# Patient Record
Sex: Male | Born: 1940 | Race: White | Hispanic: No | Marital: Married | Smoking: Former smoker
Health system: Southern US, Community
[De-identification: ages and names within clinical notes are randomized; demographics above are authoritative.]

## PROBLEM LIST (undated history)

## (undated) DIAGNOSIS — M109 Gout, unspecified: Secondary | ICD-10-CM

## (undated) DIAGNOSIS — I499 Cardiac arrhythmia, unspecified: Secondary | ICD-10-CM

## (undated) DIAGNOSIS — I82409 Acute embolism and thrombosis of unspecified deep veins of unspecified lower extremity: Secondary | ICD-10-CM

## (undated) DIAGNOSIS — I10 Essential (primary) hypertension: Secondary | ICD-10-CM

## (undated) DIAGNOSIS — I4891 Unspecified atrial fibrillation: Secondary | ICD-10-CM

## (undated) DIAGNOSIS — N400 Enlarged prostate without lower urinary tract symptoms: Secondary | ICD-10-CM

## (undated) HISTORY — PX: NASAL SINUS SURGERY: SHX719

## (undated) HISTORY — PX: FRACTURE SURGERY: SHX138

## (undated) HISTORY — PX: SHOULDER ARTHROSCOPY: SHX128

## (undated) HISTORY — PX: DUPUYTREN CONTRACTURE RELEASE: SHX1478

## (undated) HISTORY — DX: Acute embolism and thrombosis of unspecified deep veins of unspecified lower extremity: I82.409

---

## 2004-01-28 ENCOUNTER — Other Ambulatory Visit: Payer: Self-pay

## 2006-04-18 ENCOUNTER — Ambulatory Visit: Payer: Self-pay | Admitting: Nurse Practitioner

## 2008-03-24 ENCOUNTER — Emergency Department: Payer: Self-pay | Admitting: Emergency Medicine

## 2008-03-24 ENCOUNTER — Ambulatory Visit: Payer: Self-pay | Admitting: Internal Medicine

## 2009-04-07 ENCOUNTER — Ambulatory Visit: Payer: Self-pay | Admitting: Internal Medicine

## 2013-11-26 ENCOUNTER — Ambulatory Visit: Payer: Self-pay | Admitting: Unknown Physician Specialty

## 2014-04-05 ENCOUNTER — Ambulatory Visit: Payer: Self-pay | Admitting: Surgery

## 2015-04-21 ENCOUNTER — Other Ambulatory Visit: Payer: Self-pay | Admitting: Orthopedic Surgery

## 2015-04-21 DIAGNOSIS — M25511 Pain in right shoulder: Secondary | ICD-10-CM

## 2015-04-21 DIAGNOSIS — S0540XA Penetrating wound of orbit with or without foreign body, unspecified eye, initial encounter: Secondary | ICD-10-CM

## 2015-04-26 ENCOUNTER — Ambulatory Visit
Admission: RE | Admit: 2015-04-26 | Discharge: 2015-04-26 | Disposition: A | Discharge status: Home / Self Care | Payer: Medicare Other | Source: Ambulatory Visit | Attending: Orthopedic Surgery | Admitting: Orthopedic Surgery

## 2015-04-26 DIAGNOSIS — X58XXXA Exposure to other specified factors, initial encounter: Secondary | ICD-10-CM | POA: Insufficient documentation

## 2015-04-26 DIAGNOSIS — S46111A Strain of muscle, fascia and tendon of long head of biceps, right arm, initial encounter: Secondary | ICD-10-CM | POA: Insufficient documentation

## 2015-04-26 DIAGNOSIS — M25511 Pain in right shoulder: Secondary | ICD-10-CM

## 2015-04-26 DIAGNOSIS — S0540XA Penetrating wound of orbit with or without foreign body, unspecified eye, initial encounter: Secondary | ICD-10-CM

## 2015-05-25 ENCOUNTER — Encounter
Admission: RE | Admit: 2015-05-25 | Discharge: 2015-05-25 | Disposition: A | Discharge status: Home / Self Care | Payer: Medicare Other | Source: Ambulatory Visit | Attending: Orthopedic Surgery | Admitting: Orthopedic Surgery

## 2015-05-25 ENCOUNTER — Ambulatory Visit
Admission: RE | Admit: 2015-05-25 | Discharge: 2015-05-25 | Disposition: A | Discharge status: Home / Self Care | Payer: Medicare Other | Source: Ambulatory Visit | Attending: Orthopedic Surgery | Admitting: Orthopedic Surgery

## 2015-05-25 DIAGNOSIS — Z01818 Encounter for other preprocedural examination: Secondary | ICD-10-CM | POA: Insufficient documentation

## 2015-05-25 DIAGNOSIS — I10 Essential (primary) hypertension: Secondary | ICD-10-CM

## 2015-05-25 DIAGNOSIS — Z01810 Encounter for preprocedural cardiovascular examination: Secondary | ICD-10-CM | POA: Insufficient documentation

## 2015-05-25 HISTORY — DX: Essential (primary) hypertension: I10

## 2015-05-25 HISTORY — DX: Gout, unspecified: M10.9

## 2015-05-25 LAB — URINALYSIS COMPLETE WITH MICROSCOPIC (ARMC ONLY)
Bacteria, UA: NONE SEEN
Bilirubin Urine: NEGATIVE
GLUCOSE, UA: NEGATIVE mg/dL
Hgb urine dipstick: NEGATIVE
Ketones, ur: NEGATIVE mg/dL
Leukocytes, UA: NEGATIVE
Nitrite: NEGATIVE
PH: 6 (ref 5.0–8.0)
Protein, ur: NEGATIVE mg/dL
Specific Gravity, Urine: 1.014 (ref 1.005–1.030)

## 2015-05-25 LAB — BASIC METABOLIC PANEL
Anion gap: 10 (ref 5–15)
BUN: 29 mg/dL — ABNORMAL HIGH (ref 6–20)
CO2: 28 mmol/L (ref 22–32)
CREATININE: 1.12 mg/dL (ref 0.61–1.24)
Calcium: 9.5 mg/dL (ref 8.9–10.3)
Chloride: 100 mmol/L — ABNORMAL LOW (ref 101–111)
GLUCOSE: 94 mg/dL (ref 65–99)
Potassium: 3.8 mmol/L (ref 3.5–5.1)
Sodium: 138 mmol/L (ref 135–145)

## 2015-05-25 LAB — CBC
HCT: 40.3 % (ref 40.0–52.0)
Hemoglobin: 13.2 g/dL (ref 13.0–18.0)
MCH: 30.9 pg (ref 26.0–34.0)
MCHC: 32.7 g/dL (ref 32.0–36.0)
MCV: 94.3 fL (ref 80.0–100.0)
Platelets: 192 10*3/uL (ref 150–440)
RBC: 4.28 MIL/uL — AB (ref 4.40–5.90)
RDW: 14.9 % — AB (ref 11.5–14.5)
WBC: 6.9 10*3/uL (ref 3.8–10.6)

## 2015-05-25 LAB — APTT: APTT: 27 s (ref 24–36)

## 2015-05-25 LAB — PROTIME-INR
INR: 0.91
PROTHROMBIN TIME: 12.5 s (ref 11.4–15.0)

## 2015-05-25 NOTE — Patient Instructions (Signed)
  Your procedure is scheduled on:June 08, 2015 Report to Day Surgery. To find out your arrival time please call [phone] between 1PM - 3PM on August 17,2016.  Remember: Instructions that are not followed completely may result in serious medical risk, up to and including death, or upon the discretion of your surgeon and anesthesiologist your surgery may need to be rescheduled.    __x__ 1. Do not eat food or drink liquids after midnight. No gum chewing or hard candies.     __x__ 2. No Alcohol for 24 hours before or after surgery.   ____ 3. Bring all medications with you on the day of surgery if instructed.    __x__ 4. Notify your doctor if there is any change in your medical condition     (cold, fever, infections).     Do not wear jewelry, make-up, hairpins, clips or nail polish.  Do not wear lotions, powders, or perfumes. You may wear deodorant.  Do not shave 48 hours prior to surgery. Men may shave face and neck.  Do not bring valuables to the hospital.    [facility] is not responsible for any belongings or valuables.               Contacts, dentures or bridgework may not be worn into surgery.  Leave your suitcase in the car. After surgery it may be brought to your room.  For patients admitted to the hospital, discharge time is determined by your                treatment team.   Patients discharged the day of surgery will not be allowed to drive home.   Please read over the following fact sheets that you were given:   Surgical Site Infection Prevention   ____ Take these medicines the morning of surgery with A SIP OF WATER:    1. Metoprolol  2.   3.   4.  5.  6.  ____ Fleet Enema (as directed)   __x__ Use CHG Soap as directed  ____ Use inhalers on the day of surgery  ____ Stop metformin 2 days prior to surgery    ____ Take 1/2 of usual insulin dose the night before surgery and none on the morning of surgery.   __x__ Stop Coumadin/Plavix/aspirin on (Stop  aspirin one week prior to surgery)  ____ Stop Anti-inflammatories on    ____ Stop supplements until after surgery.    ____ Bring C-Pap to the hospital.

## 2015-06-08 ENCOUNTER — Ambulatory Visit: Payer: Medicare Other | Admitting: Anesthesiology

## 2015-06-08 ENCOUNTER — Encounter
Admission: RE | Disposition: A | Discharge status: Home / Self Care | Payer: Self-pay | Source: Ambulatory Visit | Attending: Orthopedic Surgery

## 2015-06-08 ENCOUNTER — Encounter: Payer: Self-pay | Admitting: *Deleted

## 2015-06-08 ENCOUNTER — Ambulatory Visit
Admission: RE | Admit: 2015-06-08 | Discharge: 2015-06-08 | Disposition: A | Discharge status: Home / Self Care | Payer: Medicare Other | Source: Ambulatory Visit | Attending: Orthopedic Surgery | Admitting: Orthopedic Surgery

## 2015-06-08 DIAGNOSIS — M19011 Primary osteoarthritis, right shoulder: Secondary | ICD-10-CM | POA: Diagnosis not present

## 2015-06-08 DIAGNOSIS — I10 Essential (primary) hypertension: Secondary | ICD-10-CM | POA: Insufficient documentation

## 2015-06-08 DIAGNOSIS — M7541 Impingement syndrome of right shoulder: Secondary | ICD-10-CM | POA: Diagnosis not present

## 2015-06-08 DIAGNOSIS — Z87891 Personal history of nicotine dependence: Secondary | ICD-10-CM | POA: Diagnosis not present

## 2015-06-08 DIAGNOSIS — M75101 Unspecified rotator cuff tear or rupture of right shoulder, not specified as traumatic: Secondary | ICD-10-CM | POA: Diagnosis present

## 2015-06-08 DIAGNOSIS — S46111A Strain of muscle, fascia and tendon of long head of biceps, right arm, initial encounter: Secondary | ICD-10-CM | POA: Insufficient documentation

## 2015-06-08 DIAGNOSIS — M24011 Loose body in right shoulder: Secondary | ICD-10-CM | POA: Insufficient documentation

## 2015-06-08 DIAGNOSIS — M109 Gout, unspecified: Secondary | ICD-10-CM | POA: Diagnosis not present

## 2015-06-08 DIAGNOSIS — Z79899 Other long term (current) drug therapy: Secondary | ICD-10-CM | POA: Insufficient documentation

## 2015-06-08 DIAGNOSIS — X58XXXA Exposure to other specified factors, initial encounter: Secondary | ICD-10-CM | POA: Insufficient documentation

## 2015-06-08 DIAGNOSIS — Z7982 Long term (current) use of aspirin: Secondary | ICD-10-CM | POA: Diagnosis not present

## 2015-06-08 HISTORY — PX: SHOULDER ARTHROSCOPY WITH OPEN ROTATOR CUFF REPAIR: SHX6092

## 2015-06-08 SURGERY — ARTHROSCOPY, SHOULDER WITH REPAIR, ROTATOR CUFF, OPEN
Anesthesia: General | Laterality: Right | Wound class: Clean

## 2015-06-08 MED ORDER — HYDROMORPHONE HCL 1 MG/ML IJ SOLN: 0.2500 mg | INTRAMUSCULAR | Status: DC | PRN

## 2015-06-08 MED ORDER — SUCCINYLCHOLINE CHLORIDE 20 MG/ML IJ SOLN
INTRAMUSCULAR | Status: DC | PRN
  Administered 2015-06-08: 80 mg via INTRAVENOUS

## 2015-06-08 MED ORDER — CEFAZOLIN SODIUM-DEXTROSE 2-3 GM-% IV SOLR
2.0000 g | Freq: Once | INTRAVENOUS | Status: AC
  Administered 2015-06-08: 2 g via INTRAVENOUS

## 2015-06-08 MED ORDER — ACETAMINOPHEN 10 MG/ML IV SOLN
INTRAVENOUS | Status: AC
  Filled 2015-06-08: qty 100

## 2015-06-08 MED ORDER — FAMOTIDINE 20 MG PO TABS
ORAL_TABLET | ORAL | Status: AC
  Administered 2015-06-08: 20 mg via ORAL
  Filled 2015-06-08: qty 1

## 2015-06-08 MED ORDER — ROCURONIUM BROMIDE 100 MG/10ML IV SOLN
INTRAVENOUS | Status: DC | PRN
  Administered 2015-06-08: 20 mg via INTRAVENOUS
  Administered 2015-06-08: 10 mg via INTRAVENOUS

## 2015-06-08 MED ORDER — FAMOTIDINE 20 MG PO TABS
20.0000 mg | ORAL_TABLET | Freq: Once | ORAL | Status: AC
  Administered 2015-06-08: 20 mg via ORAL

## 2015-06-08 MED ORDER — OXYCODONE HCL 5 MG PO TABS: 5.0000 mg | ORAL_TABLET | ORAL | Status: DC | PRN

## 2015-06-08 MED ORDER — ONDANSETRON HCL 4 MG/2ML IJ SOLN: 4.0000 mg | Freq: Once | INTRAMUSCULAR | Status: DC | PRN

## 2015-06-08 MED ORDER — MIDAZOLAM HCL 2 MG/2ML IJ SOLN
INTRAMUSCULAR | Status: DC | PRN
  Administered 2015-06-08: 2 mg via INTRAVENOUS

## 2015-06-08 MED ORDER — FENTANYL CITRATE (PF) 100 MCG/2ML IJ SOLN: 25.0000 ug | INTRAMUSCULAR | Status: DC | PRN

## 2015-06-08 MED ORDER — PROPOFOL 10 MG/ML IV BOLUS
INTRAVENOUS | Status: DC | PRN
  Administered 2015-06-08: 150 mg via INTRAVENOUS

## 2015-06-08 MED ORDER — EPINEPHRINE HCL 1 MG/ML IJ SOLN
INTRAMUSCULAR | Status: DC | PRN
  Administered 2015-06-08: 7 mL

## 2015-06-08 MED ORDER — ACETAMINOPHEN 10 MG/ML IV SOLN
INTRAVENOUS | Status: DC | PRN
  Administered 2015-06-08: 1000 mg via INTRAVENOUS

## 2015-06-08 MED ORDER — LACTATED RINGERS IV SOLN
INTRAVENOUS | Status: DC
  Administered 2015-06-08: 07:00:00 via INTRAVENOUS

## 2015-06-08 MED ORDER — FENTANYL CITRATE (PF) 100 MCG/2ML IJ SOLN
INTRAMUSCULAR | Status: DC | PRN
  Administered 2015-06-08: 50 ug via INTRAVENOUS
  Administered 2015-06-08: 100 ug via INTRAVENOUS
  Administered 2015-06-08: 50 ug via INTRAVENOUS

## 2015-06-08 MED ORDER — NEOMYCIN-POLYMYXIN B GU 40-200000 IR SOLN
Status: DC | PRN
  Administered 2015-06-08: 2 mL

## 2015-06-08 MED ORDER — ONDANSETRON HCL 4 MG PO TABS: 4.0000 mg | ORAL_TABLET | Freq: Four times a day (QID) | ORAL | Status: DC | PRN

## 2015-06-08 MED ORDER — LIDOCAINE HCL 1 % IJ SOLN
INTRAMUSCULAR | Status: DC | PRN
Start: 2015-06-08 — End: 2015-06-08
  Administered 2015-06-08: 15 mL

## 2015-06-08 MED ORDER — CEFAZOLIN SODIUM-DEXTROSE 2-3 GM-% IV SOLR
INTRAVENOUS | Status: AC
  Filled 2015-06-08: qty 50

## 2015-06-08 MED ORDER — EPHEDRINE SULFATE 50 MG/ML IJ SOLN
INTRAMUSCULAR | Status: DC | PRN
  Administered 2015-06-08: 5 mg via INTRAVENOUS
  Administered 2015-06-08: 10 mg via INTRAVENOUS
  Administered 2015-06-08: 5 mg via INTRAVENOUS

## 2015-06-08 MED ORDER — BUPIVACAINE HCL 0.25 % IJ SOLN
INTRAMUSCULAR | Status: DC | PRN
  Administered 2015-06-08: 30 mL

## 2015-06-08 SURGICAL SUPPLY — 65 items
ADAPTER IRRIG TUBE 2 SPIKE SOL (ADAPTER) ×6 IMPLANT
ANCHOR DBLROW ROT CUFF 2.8 MAG (Anchor) ×6 IMPLANT
ARTHROWAND PARAGON T2 (SURGICAL WAND) ×3
BUR RADIUS 4.0X18.5 (BURR) ×3 IMPLANT
BUR RADIUS 5.5 (BURR) ×3 IMPLANT
CANNULA 5.75X7 CRYSTAL CLEAR (CANNULA) ×6 IMPLANT
CANNULA PARTIAL THREAD 2X7 (CANNULA) ×3 IMPLANT
CANNULA TWIST IN 8.25X9CM (CANNULA) ×6 IMPLANT
CLOSURE WOUND 1/2 X4 (GAUZE/BANDAGES/DRESSINGS) ×1
CONNECTOR M SMARTSTITCH (Connector) ×3 IMPLANT
COOLER POLAR GLACIER W/PUMP (MISCELLANEOUS) ×3 IMPLANT
DRAPE IMP U-DRAPE 54X76 (DRAPES) ×6 IMPLANT
DRAPE INCISE IOBAN 66X45 STRL (DRAPES) ×3 IMPLANT
DRAPE U-SHAPE 47X51 STRL (DRAPES) ×3 IMPLANT
DURAPREP 26ML APPLICATOR (WOUND CARE) ×9 IMPLANT
GAUZE PETRO XEROFOAM 1X8 (MISCELLANEOUS) ×3 IMPLANT
GAUZE SPONGE 4X4 12PLY STRL (GAUZE/BANDAGES/DRESSINGS) ×6 IMPLANT
GLOVE BIOGEL PI IND STRL 9 (GLOVE) ×1 IMPLANT
GLOVE BIOGEL PI INDICATOR 9 (GLOVE) ×2
GLOVE SURG 9.0 ORTHO LTXF (GLOVE) ×6 IMPLANT
GOWN STRL REUS TWL 2XL XL LVL4 (GOWN DISPOSABLE) ×3 IMPLANT
GOWN STRL REUS W/ TWL LRG LVL3 (GOWN DISPOSABLE) ×1 IMPLANT
GOWN STRL REUS W/ TWL LRG LVL4 (GOWN DISPOSABLE) ×1 IMPLANT
GOWN STRL REUS W/TWL LRG LVL3 (GOWN DISPOSABLE) ×2
GOWN STRL REUS W/TWL LRG LVL4 (GOWN DISPOSABLE) ×2
IV LACTATED RINGER IRRG 3000ML (IV SOLUTION) ×14
IV LR IRRIG 3000ML ARTHROMATIC (IV SOLUTION) ×7 IMPLANT
KIT RM TURNOVER STRD PROC AR (KITS) ×3 IMPLANT
KIT STABILIZATION SHOULDER (MISCELLANEOUS) ×3 IMPLANT
MANIFOLD NEPTUNE II (INSTRUMENTS) ×3 IMPLANT
MASK FACE SPIDER DISP (MASK) ×3 IMPLANT
MAT BLUE FLOOR 46X72 FLO (MISCELLANEOUS) ×6 IMPLANT
NDL SAFETY 18GX1.5 (NEEDLE) ×3 IMPLANT
NDL SAFETY 22GX1.5 (NEEDLE) ×3 IMPLANT
NS IRRIG 500ML POUR BTL (IV SOLUTION) ×3 IMPLANT
PACK ARTHROSCOPY SHOULDER (MISCELLANEOUS) ×3 IMPLANT
PAD GROUND ADULT SPLIT (MISCELLANEOUS) ×3 IMPLANT
PAD WRAPON POLAR SHDR UNIV (MISCELLANEOUS) ×1 IMPLANT
PASSER SUT CAPTURE FIRST (SUTURE) ×3 IMPLANT
SET TUBE SUCT SHAVER OUTFL 24K (TUBING) ×3 IMPLANT
SET TUBE TIP INTRA-ARTICULAR (MISCELLANEOUS) ×3 IMPLANT
SLING ULTRA II LG (MISCELLANEOUS) IMPLANT
SLING ULTRA II M (MISCELLANEOUS) ×3 IMPLANT
STRIP CLOSURE SKIN 1/2X4 (GAUZE/BANDAGES/DRESSINGS) ×2 IMPLANT
SUT CO BRAID (SUTURE) ×9 IMPLANT
SUT ETHILON 4-0 (SUTURE)
SUT ETHILON 4-0 FS2 18XMFL BLK (SUTURE)
SUT KNTLS 2.8 MAGNUM (Anchor) ×9 IMPLANT
SUT MNCRL 4-0 (SUTURE)
SUT MNCRL 4-0 27XMFL (SUTURE)
SUT PDS AB 0 CT1 27 (SUTURE) IMPLANT
SUT VIC AB 0 CT1 36 (SUTURE) IMPLANT
SUT VIC AB 2-0 CT2 27 (SUTURE) ×3 IMPLANT
SUTURE ETHLN 4-0 FS2 18XMF BLK (SUTURE) IMPLANT
SUTURE MAGNUM WIRE 2X48 BLK (SUTURE) ×6 IMPLANT
SUTURE MNCRL 4-0 27XMF (SUTURE) IMPLANT
SUTURE OPUS MAGNUM SZ 2 WHT (SUTURE) ×9 IMPLANT
SYRINGE 10CC LL (SYRINGE) ×3 IMPLANT
TAPE MICROFOAM 4IN (TAPE) ×3 IMPLANT
TUBING ARTHRO INFLOW-ONLY STRL (TUBING) ×3 IMPLANT
TUBING CONNECTING 10 (TUBING) ×2 IMPLANT
TUBING CONNECTING 10' (TUBING) ×1
WAND ARTHRO PARAGON T2 (SURGICAL WAND) ×1 IMPLANT
WAND HAND CNTRL MULTIVAC 90 (MISCELLANEOUS) ×3 IMPLANT
WRAPON POLAR PAD SHDR UNIV (MISCELLANEOUS) ×3

## 2015-06-08 NOTE — Transfer of Care (Signed)
Immediate Anesthesia Transfer of Care Note  Patient: Nathan Haynes  Procedure(s) Performed: Procedure(s): SHOULDER ARTHROSCOPY WITH OPEN ROTATOR CUFF REPAIR (Right)  Patient Location: PACU  Anesthesia Type:General  Level of Consciousness: awake, alert  and oriented  Airway & Oxygen Therapy: Patient Spontanous Breathing and Patient connected to face mask oxygen  Post-op Assessment: Report given to RN and Post -op Vital signs reviewed and stable  Post vital signs: Reviewed and stable  Last Vitals:  Filed Vitals:   06/08/15 1032  BP: 112/63  Pulse: 66  Temp: 37.1 C  Resp: 16    Complications: No apparent anesthesia complications

## 2015-06-08 NOTE — Anesthesia Procedure Notes (Signed)
Procedure Name: Intubation Date/Time: 06/08/2015 7:43 AM Performed by: Omer Jack Pre-anesthesia Checklist: Patient identified, Patient being monitored, Timeout performed, Emergency Drugs available and Suction available Patient Re-evaluated:Patient Re-evaluated prior to inductionOxygen Delivery Method: Circle system utilized Preoxygenation: Pre-oxygenation with 100% oxygen Intubation Type: IV induction Ventilation: Mask ventilation without difficulty Laryngoscope Size: 3 and Glidescope Grade View: Grade II Tube type: Oral Tube size: 7.5 mm Number of attempts: 1 Airway Equipment and Method: Stylet Placement Confirmation: ETT inserted through vocal cords under direct vision,  positive ETCO2 and breath sounds checked- equal and bilateral Secured at: 21 cm Tube secured with: Tape Dental Injury: Teeth and Oropharynx as per pre-operative assessment

## 2015-06-08 NOTE — Anesthesia Preprocedure Evaluation (Signed)
Anesthesia Evaluation  Patient identified by MRN, date of birth, ID band Patient awake    Reviewed: Allergy & Precautions, H&P , NPO status , Patient's Chart, lab work & pertinent test results, reviewed documented beta blocker date and time   Airway Mallampati: II  TM Distance: >3 FB Neck ROM: full    Dental  (+) Teeth Intact, Poor Dentition, Chipped   Pulmonary neg pulmonary ROS, former smoker,    Pulmonary exam normal       Cardiovascular Exercise Tolerance: Good hypertension, On Medications negative cardio ROS Normal cardiovascular examRhythm:regular Rate:Normal     Neuro/Psych    GI/Hepatic negative GI ROS, Neg liver ROS,   Endo/Other  negative endocrine ROS  Renal/GU negative Renal ROS     Musculoskeletal   Abdominal   Peds  Hematology negative hematology ROS (+)   Anesthesia Other Findings   Reproductive/Obstetrics negative OB ROS                             Anesthesia Physical Anesthesia Plan  ASA: III  Anesthesia Plan: General ETT   Post-op Pain Management:    Induction:   Airway Management Planned:   Additional Equipment:   Intra-op Plan:   Post-operative Plan:   Informed Consent: I have reviewed the patients History and Physical, chart, labs and discussed the procedure including the risks, benefits and alternatives for the proposed anesthesia with the patient or authorized representative who has indicated his/her understanding and acceptance.     Plan Discussed with: CRNA  Anesthesia Plan Comments:         Anesthesia Quick Evaluation

## 2015-06-08 NOTE — Op Note (Signed)
06/08/2015  11:14 AM  PATIENT:  Nathan Haynes  74 y.o. 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  SURGEON:  Surgeon(s) and Role:    * Thornton Park, MD - Primary  ANESTHESIA:   general and local with 1% lidocaine plain for injection of the arthroscopy incisions and 0.25% Marcaine plain into the subacromial space postoperatively   PREOPERATIVE INDICATIONS:  Nathan Haynes is a  74 y.o. male with a diagnosis of right shoulder FULL THICKNESS rotator cuff tear who failed conservative management including NSAIDs, exercise and subacromial injections and elected for surgical management.  An MRI of the right shoulder has revealed a full-thickness and retracted rotator cuff tear involving the supra and infraspinatus  The risks benefits and alternatives were discussed with the patient preoperatively including but not limited to the risks of infection, bleeding, nerve injury, persistent pain or weakness, failure of the hardware, re-tear of the rotator cuff and the need for further surgery. Medical risks include DVT and pulmonary embolism, myocardial infarction, stroke, pneumonia, respiratory failure and death. Patient understood these risks and wished to proceed.  OPERATIVE IMPLANTS: ArthroCare Magnum M anchors 2 and Magnum 2 anchors 3  OPERATIVE FINDINGS: Full-thickness rotator cuff tear involving the supra and infraspinatus with retraction to the humeral head,  spontaneous rupture of the biceps tendon, mild glenohumeral arthrosis with intra-articular loose body  OPERATIVE PROCEDURE: The patient was met in the preoperative area. The operative shoulder was signed with the word yes and my initials according the hospital's correct site of surgery protocol. The patient underwent placement of an interscalene block by the anesthesia service.  Patient was brought to the operating room where they underwent general endotracheal intubation. They were placed in a beachchair position.  A spider arm positioner was used for this case. Examination under anesthesia revealed no limitation of motion or instability with load shift testing. The patient had a negative sulcus sign.  Patient was prepped and draped in a sterile fashion. A timeout was performed to verify the patient's name, date of birth, medical record number, correct site of surgery and correct procedure to be performed there was also used to verify the patient received antibiotics that all appropriate instruments, implants and radiographs studies were available in the room. Once all in attendance were in agreement case began.  Bony landmarks were drawn out with a surgical marker along with proposed arthroscopy incisions. These were pre-injected with 1% lidocaine plain. An 11 blade was used to establish a posterior portal through which the arthroscope was placed in the glenohumeral joint. A full diagnostic examination of the shoulder was performed.    Findings on arthroscopy: As above  The arthroscope was then placed in the subacromial space. Extensive bursitis was encountered and debrided using a 4-0 resector shaver blade and a 90 ArthroCare wand from a lateral portal which was established under direct visualization using an 18-gauge spinal needle. A subacromial decompression was also performed using a 5.5 mm resector shaver blade from the lateral portal. Through the anterior portal, the 5.5 mm resector shaver blade  was inserted and a distal clavicle excision  was performed. The patient had advanced arthritis in the acromioclavicular joint with minimal joint space left. The greater tuberosity footprint was also debrided with a 5.5 mm resector shaver blade from the lateral portal until punctate bleeding was identified which will assist the tendon to bone healing. Three Smart stitches were placed in the lateral border of the rotator cuff tear. All arthroscopic instruments were then removed and the mini-open portion of the case began.   A saber-type incision was made along the lateral border of the acromion. The deltoid muscle was identified and split in line with its fibers which allowed visualization of the rotator cuff. The Smart stitches previously placed in the lateral border of the rotator cuff were brought out through the deltoid split.  A 5.5 mm resector shaver blade was then used to bur the greater tuberosity removing all torn fibers of the rotator cuff.  Two Magnum M anchors were placed at the articular margin of the humeral head with the greater tuberosity. The suture limbs of the Magnum M anchor were passed medially through the rotator cuff using a first pass suture passer.  The Smart stitches were then anchored to the humeral head using Magnum 2 anchors. These anchors were tensioned to allow for anatomic reduction of the rotator cuff to the greater tuberosity footprint. Arthroscopic images of the repair were taken with the arthroscope both externally and from inside the glenohumeral joint.  All incisions were copiously irrigated. The deltoid fascia was repaired using a 0 Vicryl suture.  The subcutaneous tissue of all incisions were closed with a 2-0 Vicryl. Skin closure for the arthroscopic incisions was performed with 4-0 nylon. The skin edges of the saber incision was approximated with a running 4-0 undyed Monocryl.  Steri-Strips were applied over the incisions. 30 cc of 0.25% Marcaine plain was injected in  subacromial space to assist with postop pain control. A dry sterile dressing was applied.  The patient was placed in an abduction sling, with a Polar Caresleeve, a TENS unit.  All sharp and it instrument counts were correct at the conclusion of the case. I was scrubbed and present for the entire case. I spoke with the patient's family postoperatively to let them know the case had gone without complication and the patient was stable in recovery room.    Timoteo Gaul, MD

## 2015-06-08 NOTE — Discharge Instructions (Addendum)
Wear sling at all times, including sleep.  You will need to use the sling for a total of 4 weeks following surgery.  Do not try and lift your arm away from your body for any reason.   Keep the dressing dry.  You may remove bandage in 3 days.  Leave the Steri-Strips (white medical tape) in place.  You may place additional Band-Aids over top of the Steri-Strips if you wish.  May shower once dressing is removed in 3 days.  Remove sling carefully only for showers, leaving arm down by your side while in the shower.  Make sure to take some pain medication this evening before you fall asleep, in preparation for the nerve block wearing off in the middle of the night.  If the the pain medication causes itching, or is too strong, try taking a single tablet at a time, or combining with Benadryl.  You may be most comfortable sleeping in a recliner.  If you do sleep in near bed, placed pillows behind the shoulder that have the operation to support it.  AMBULATORY SURGERY  DISCHARGE INSTRUCTIONS   1) The drugs that you were given will stay in your system until tomorrow so for the next 24 hours you should not:  A) Drive an automobile B) Make any legal decisions C) Drink any alcoholic beverage  2) You may resume regular meals tomorrow.  Today it is better to start with liquids and gradually work up to solid foods.  You may eat anything you prefer, but it is better to start with liquids, then soup and crackers, and gradually work up to solid foods.  3) Please notify your doctor immediately if you have any unusual bleeding, trouble breathing, redness and pain at the surgery site, drainage, fever, or pain not relieved by medication.  4) Additional Instructions:  Please contact your physician with any problems or Same Day Surgery at [phone], Monday through Friday 6 am to 4 pm, or  at [place] Main number at [phone]. 

## 2015-06-08 NOTE — H&P (Addendum)
PREOPERATIVE H&P  Chief Complaint: FULL THICKNESS R.C.R, shoulder  HPI: Nathan Haynes is a 74 y.o. right-hand dominant male who presents for preoperative history and physical with a diagnosis of FULL THICKNESS ROTATOR cuff tear of his right shoulder. MRI shows that a full-thickness tear with retraction. Symptoms are rated as moderate to severe, and have been worsening.  This is significantly impairing activities of daily living.  He has elected for surgical management. He has failed nonoperative management including exercise, and NSAIDS and subacromial cortical steroids injection.    Past Medical History  Diagnosis Date  . Hypertension   . Gout attack    Past Surgical History  Procedure Laterality Date  . Shoulder arthroscopy Left   . Fracture surgery Right     clavicle  . Dupuytren contracture release Bilateral   . Nasal sinus surgery     Social History   Social History  . Marital Status: Married    Spouse Name: N/A  . Number of Children: N/A  . Years of Education: N/A   Social History Main Topics  . Smoking status: Former Smoker -- 1.00 packs/day    Types: Cigarettes  . Smokeless tobacco: Never Used  . Alcohol Use: 1.2 oz/week    2 Glasses of wine per week  . Drug Use: No  . Sexual Activity: Not Asked   Other Topics Concern  . None   Social History Narrative   History reviewed. No pertinent family history. No Known Allergies Prior to Admission medications   Medication Sig Start Date End Date Taking? Authorizing Provider  aspirin EC 81 MG tablet Take 81 mg by mouth daily.   Yes Historical Provider, MD  atorvastatin (LIPITOR) 20 MG tablet Take 20 mg by mouth daily.   Yes Historical Provider, MD  losartan-hydrochlorothiazide (HYZAAR) 100-25 MG per tablet Take 1 tablet by mouth daily.   Yes Historical Provider, MD  metoprolol succinate (TOPROL-XL) 100 MG 24 hr tablet Take 100 mg by mouth daily. Take with or immediately following a meal.   Yes Historical Provider, MD      Positive ROS: All other systems have been reviewed and were otherwise negative with the exception of those mentioned in the HPI and as above.  Physical Exam: General: Alert, no acute distress Cardiovascular: Regular rate and rhythm, no murmurs rubs or gallops.  No pedal edema Respiratory: Clear to auscultation bilaterally, no wheezes rales or rhonchi. No cyanosis, no use of accessory musculature GI: No organomegaly, abdomen is soft and non-tender nondistended with positive bowel sounds. Skin: Skin intact, no lesions within the operative field. Neurologic: Sensation intact distally Psychiatric: Patient is competent for consent with normal mood and affect Lymphatic: No axillary or cervical lymphadenopathy  MUSCULOSKELETAL: Right shoulder: Patient can abduct actively to approximately 60 before pain limits further motion. He can forward elevate to approximately 80-90 with pain. He has significant weakness of his supraspinatus and external rotators. Subscapularis function is 5 out of 5 strength. He has intact sensation light touch in palpable radial pulse. He has before meals joint tenderness but no step-off.     Assessment: FULL THICKNESS R.C.R, Right shoulder  Plan: Plan for Procedure(s): SHOULDER ARTHROSCOPY WITH OPEN ROTATOR CUFF REPAIR  And a long discussion my office prior to the date of surgery with the patient and his wife. I explained in detail the operation and postoperative course including 4 weeks in a sling. Patient wished to proceed with surgery given his failure of conservative management and the profound affect his symptoms are  having on his activities of daily living.  I discussed the risks and benefits of surgery. The risks include but are not limited to infection, bleeding requiring blood transfusion, nerve or blood vessel injury, joint stiffness or loss of motion, persistent pain, weakness or instability, malunion, nonunion and hardware failure and the need for  further surgery. Medical risks include but are not limited to DVT and pulmonary embolism, myocardial infarction, stroke, pneumonia, respiratory failure and death. Patient understood these risks and wished to proceed.   Juanell Fairly, MD   06/08/2015 7:25 AM

## 2015-06-08 NOTE — Anesthesia Postprocedure Evaluation (Signed)
  Anesthesia Post-op Note  Patient: Nathan Haynes  Procedure(s) Performed: Procedure(s): SHOULDER ARTHROSCOPY WITH OPEN ROTATOR CUFF REPAIR (Right)  Anesthesia type:General ETT  Patient location: PACU  Post pain: Pain level controlled  Post assessment: Post-op Vital signs reviewed, Patient's Cardiovascular Status Stable, Respiratory Function Stable, Patent Airway and No signs of Nausea or vomiting  Post vital signs: Reviewed and stable  Last Vitals:  Filed Vitals:   06/08/15 1221  BP: 123/67  Pulse: 54  Temp:   Resp: 16    Level of consciousness: awake, alert  and patient cooperative  Complications: No apparent anesthesia complications

## 2016-08-23 IMAGING — MR MR SHOULDER*R* W/O CM
5 series · 40 of 40 positions shown · non-contrast
Comparison: None.

CLINICAL DATA: Right shoulder pain with decreased range of motion.

EXAM:
MRI OF THE RIGHT SHOULDER WITHOUT CONTRAST
TECHNIQUE: Multiplanar, multisequence MR imaging of the shoulder was performed.
No intravenous contrast was administered.

[Series 5: T2 fat-sat · axial · 4.0mm · 0.47mm/px · z∈[-65,+45]mm · 10 of 26 slices shown (1 of 2)]
[im 1/26]
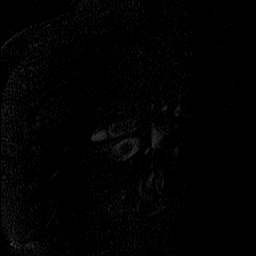
[im 3/26]
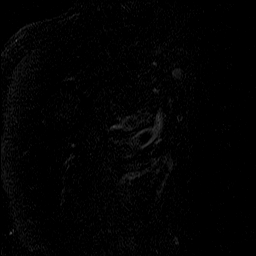
[im 6/26]
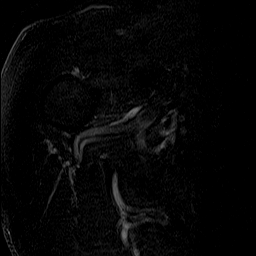
[im 9/26]
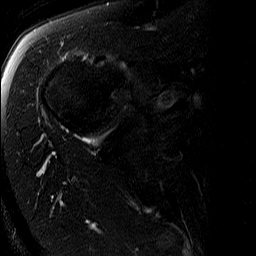
[im 12/26]
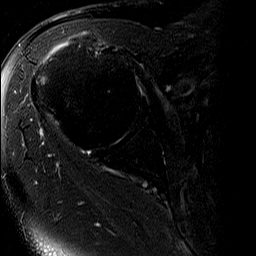
[im 14/26]
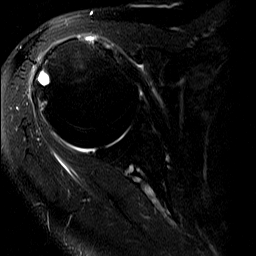
[im 17/26]
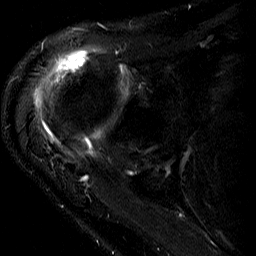
[im 20/26]
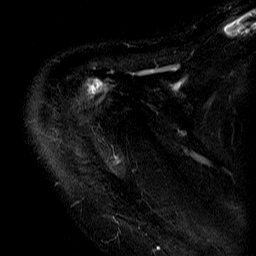
[im 23/26]
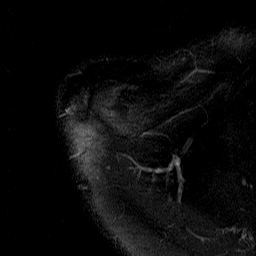
[im 26/26]
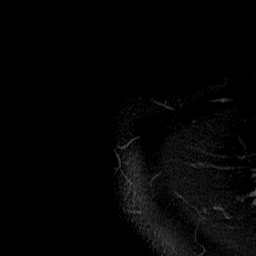

[Series 6: PD · oblique · 4.0mm · 0.62mm/px · 7 of 19 slices shown]
[im 1/19]
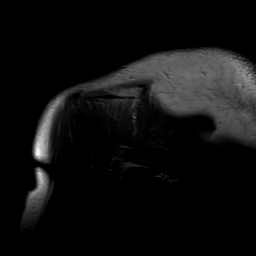
[im 4/19]
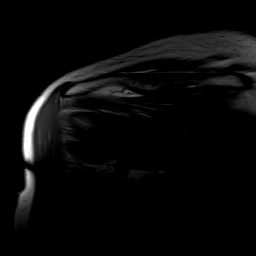
[im 7/19]
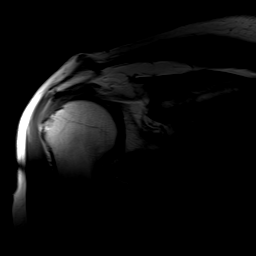
[im 10/19]
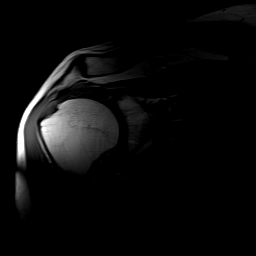
[im 13/19]
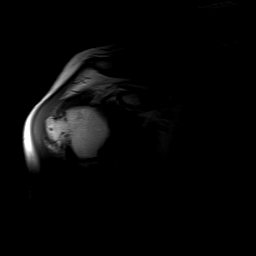
[im 16/19]
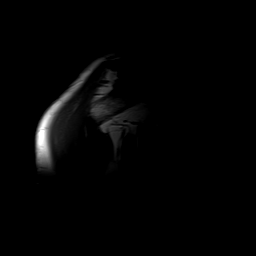
[im 19/19]
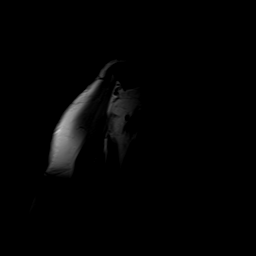

[Series 7: T1 · oblique · 4.0mm · 0.62mm/px · 8 of 23 slices shown]
[im 1/23]
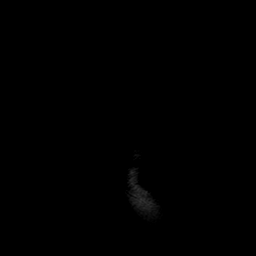
[im 4/23]
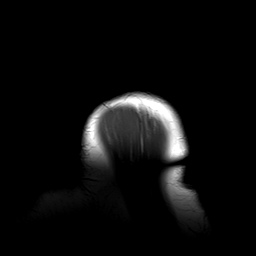
[im 7/23]
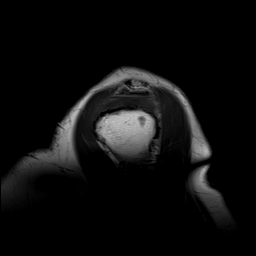
[im 10/23]
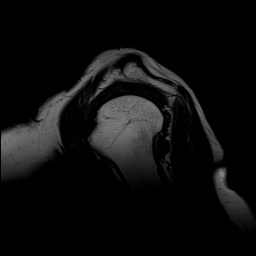
[im 13/23]
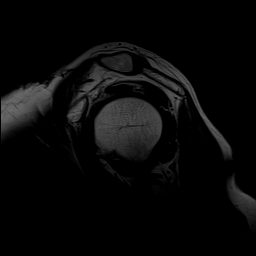
[im 16/23]
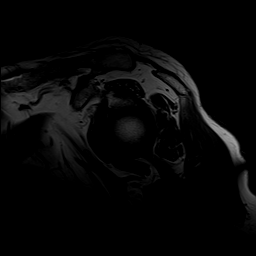
[im 19/23]
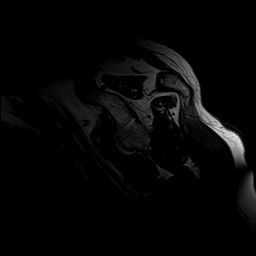
[im 23/23]
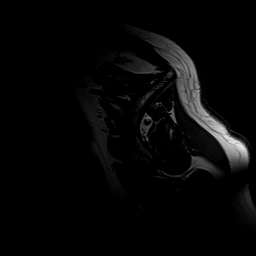

[Series 8: T2 fat-sat · oblique · 4.0mm · 0.62mm/px · 8 of 23 slices shown (2 of 2)]
[im 1/23]
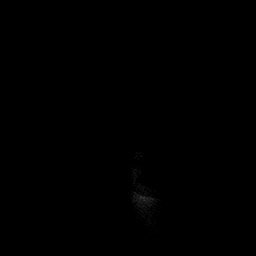
[im 4/23]
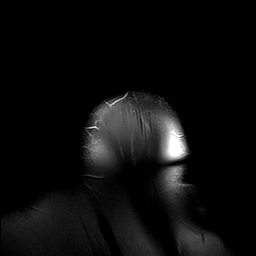
[im 7/23]
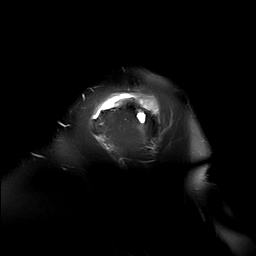
[im 10/23]
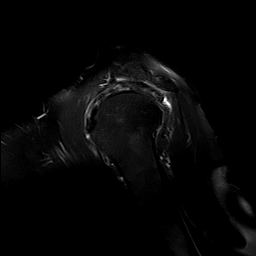
[im 13/23]
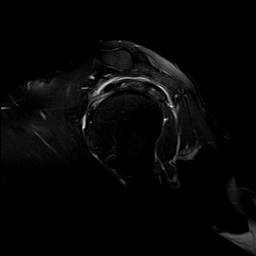
[im 16/23]
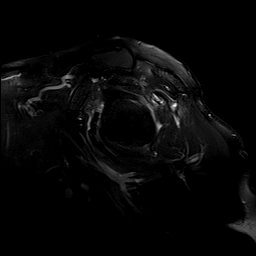
[im 19/23]
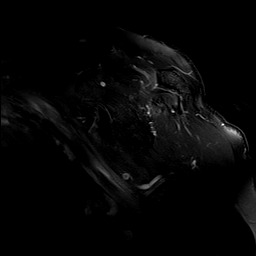
[im 23/23]
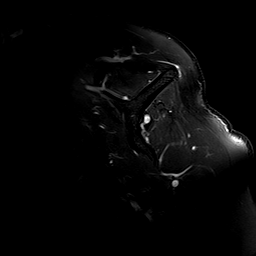

[Series 101: T2 · oblique · 4.0mm · 0.62mm/px · 7 of 19 slices shown]
[im 1/19]
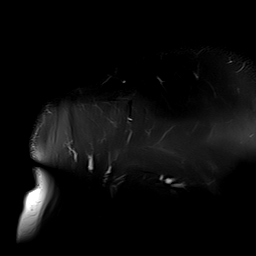
[im 4/19]
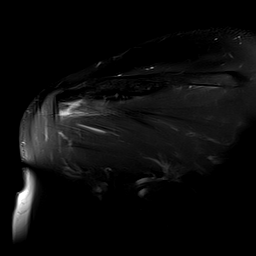
[im 7/19]
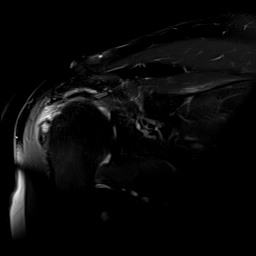
[im 10/19]
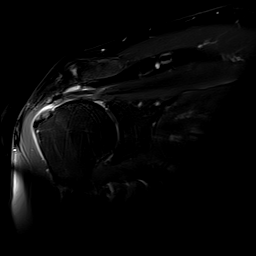
[im 13/19]
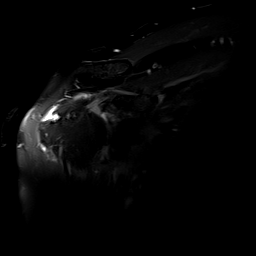
[im 16/19]
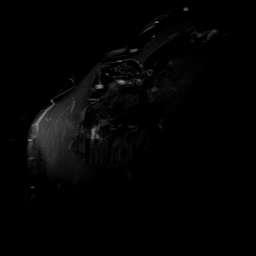
[im 19/19]
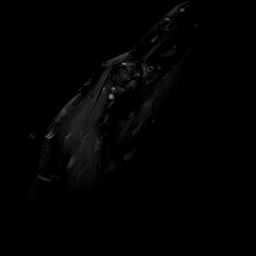

[40 of 40 positions shown; findings below may reference images not displayed]

FINDINGS: Rotator cuff: 27 x 15 mm full-thickness slightly retracted tear of
the supraspinatus tendon. Severe degenerative tendinopathy of the
distal infraspinatus without a definitive tear distally. There is a
small intrasubstance tear at the musculotendinous junction. Teres
minor and subscapularis tendons are normal.

Muscles:  Slight atrophy of all of the muscles of the rotator cuff.

Biceps long head: Properly located and intact. Degeneration of the
intra-articular portion of the tendon.

Acromioclavicular Joint:  Normal.  Type 1 acromion.

Glenohumeral Joint: Normal.

Labrum:  Normal.

Bones: Focal 8 mm degenerative cyst in the greater tuberosity.
Otherwise normal.
IMPRESSION: Large full-thickness retracted tear of the supraspinatus tendon.
Severe degeneration of the distal infraspinatus tendon.

## 2017-07-16 ENCOUNTER — Other Ambulatory Visit: Payer: Self-pay | Admitting: Family Medicine

## 2017-07-16 DIAGNOSIS — Z136 Encounter for screening for cardiovascular disorders: Secondary | ICD-10-CM

## 2017-07-21 ENCOUNTER — Ambulatory Visit: Payer: Medicare HMO

## 2017-07-25 ENCOUNTER — Ambulatory Visit (INDEPENDENT_AMBULATORY_CARE_PROVIDER_SITE_OTHER): Payer: Medicare HMO | Admitting: Vascular Surgery

## 2017-07-25 ENCOUNTER — Encounter (INDEPENDENT_AMBULATORY_CARE_PROVIDER_SITE_OTHER): Payer: Self-pay | Admitting: Vascular Surgery

## 2017-07-25 VITALS — BP 126/75 | HR 61 | Resp 16 | Ht 69.0 in | Wt 167.0 lb

## 2017-07-25 DIAGNOSIS — M7989 Other specified soft tissue disorders: Secondary | ICD-10-CM | POA: Diagnosis not present

## 2017-07-25 DIAGNOSIS — M109 Gout, unspecified: Secondary | ICD-10-CM

## 2017-07-25 DIAGNOSIS — I10 Essential (primary) hypertension: Secondary | ICD-10-CM

## 2017-07-25 NOTE — Assessment & Plan Note (Signed)
blood pressure control important in reducing the progression of atherosclerotic disease. On appropriate oral medications.  

## 2017-07-25 NOTE — Assessment & Plan Note (Signed)

## 2017-07-25 NOTE — Progress Notes (Signed)
Patient ID: Nathan Haynes, male   DOB: [birth year], 75 y.o.   MRN: [id number]  Chief Complaint  Patient presents with  . New Patient (Initial Visit)    VV and Cellulitis    HPI Nathan Haynes is a 76 y.o. male.  I am asked to see the patient by K. Rosalie Gums, NP  for evaluation of left leg swelling. The patient has had a handful of gout attacks over the past few years affecting the left foot and ankle. These are exquisitely painful and associated with swelling. More recently, over the past couple of months he has had left lower extremity swelling down around the foot and ankle. On this instance, it is not exquisitely painful. It has been coming more of a nuisance to him. He denies any fever or chills. He has had cellulitis and gout previously in that left foot and ankle. He has not had ulceration or infection. He denies fever or chills. He has no right lower extremity symptoms. No recent causative event or inciting factor that started the swelling. Elevation and when he first wakes up in the morning resolves the swelling. 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 The patient has no previous history of deep venous thrombosis or superficial thrombophlebitis to their knowledge.     Past Medical History:  Diagnosis Date  . Gout attack   . Hypertension     Past Surgical History:  Procedure Laterality Date  . DUPUYTREN CONTRACTURE RELEASE Bilateral   . FRACTURE SURGERY Right    clavicle  . NASAL SINUS SURGERY    . SHOULDER ARTHROSCOPY Left   . SHOULDER ARTHROSCOPY WITH OPEN ROTATOR CUFF REPAIR Right 06/08/2015   Procedure: SHOULDER ARTHROSCOPY WITH OPEN ROTATOR CUFF REPAIR;  Surgeon: Juanell Fairly, MD;  Location: [facility] ORS;  Service: Orthopedics;  Laterality: Right;    Family history No bleeding or clotting disorders  Social History Social History  Substance Use Topics  . Smoking status: Former Smoker    Packs/day:  1.00    Types: Cigarettes  . Smokeless tobacco: Never Used  . Alcohol use 1.2 oz/week    2 Glasses of wine per week    No Known Allergies  Current Outpatient Prescriptions  Medication Sig Dispense Refill  . aspirin EC 81 MG tablet Take 81 mg by mouth daily.    Marland Kitchen atorvastatin (LIPITOR) 20 MG tablet Take 20 mg by mouth daily.    Marland Kitchen losartan-hydrochlorothiazide (HYZAAR) 100-25 MG per tablet Take 1 tablet by mouth daily.    . metoprolol succinate (TOPROL-XL) 100 MG 24 hr tablet Take 100 mg by mouth daily. Take with or immediately following a meal.    . oxyCODONE (OXY IR/ROXICODONE) 5 MG immediate release tablet Take 1-2 tablets (5-10 mg total) by mouth every 4 (four) hours as needed for severe pain. 60 tablet 0  . ondansetron (ZOFRAN) 4 MG tablet Take 1 tablet (4 mg total) by mouth every 6 (six) hours as needed for nausea or vomiting. (Patient not taking: Reported on 07/25/2017) 30 tablet 0   No current facility-administered medications for this visit.       REVIEW OF SYSTEMS (Negative unless checked)  Constitutional: Weight loss  Fever  Chills Cardiac: Chest pain   Chest pressure   Palpitations   Shortness of breath when laying flat   Shortness of breath at rest   Shortness of breath with exertion. Vascular:  Pain in legs with walking     Pain in legs at rest   Pain in legs when laying flat   Claudication   Pain in feet when walking  Pain in feet at rest  Pain in feet when laying flat   History of DVT   Phlebitis   Swelling in legs   Varicose veins   Non-healing ulcers Pulmonary:   Uses home oxygen   Productive cough   Hemoptysis   Wheeze  COPD   Asthma Neurologic:  Dizziness  Blackouts   Seizures   History of stroke   History of TIA  Aphasia   Temporary blindness   Dysphagia   Weakness or numbness in arms   Weakness or numbness in legs Musculoskeletal:  Arthritis   Joint swelling   Joint pain   Low back  pain Hematologic:  Easy bruising  Easy bleeding   Hypercoagulable state   Anemic  Hepatitis Gastrointestinal:  Blood in stool   Vomiting blood  Gastroesophageal reflux/heartburn   Abdominal pain Genitourinary:  Chronic kidney disease   Difficult urination  Frequent urination  Burning with urination   Hematuria Skin:  Rashes   Ulcers   Wounds Psychological:  History of anxiety    History of major depression.    Physical Exam BP 126/75 (BP Location: Right Arm)   Pulse 61   Resp 16   Ht  (1.753 m)   Wt 75.8 kg (167 lb)   BMI 24.66 kg/m  Gen:  WD/WN, NAD. Appears younger than stated age Head: [place]/AT, No temporalis wasting.  Ear/Nose/Throat: Hearing grossly intact, dentition Good Eyes: Sclera non-icteric. Conjunctiva clear Neck: Supple, no nuchal rigidity. Trachea midline Pulmonary:  Good air movement, no use of accessory muscles, respirations not labored.  Cardiac: RRR, No JVD Vascular: Varicosities scant in the right lower extremity        Varicosities scattered and measuring up to 2 mm in the left lower extremity Vessel Right Left  Radial Palpable Palpable                          PT Palpable 1+ Palpable  DP Palpable Palpable    Musculoskeletal: M/S 5/5 throughout.   No RLE edema.  1 + LLE edema most prominent around the foot and ankle area Neurologic: Sensation grossly intact in extremities.  Symmetrical.  Speech is fluent.  Psychiatric: Judgment intact, Mood & affect appropriate for pt's clinical situation. Dermatologic: No rashes or ulcers noted.  No cellulitis or open wounds.    Radiology No results found.  Labs No results found for this or any previous visit (from the past 2160 hour(s)).  Assessment/Plan:  Gout attack Possible this was a nidus for starting of lymphedema.  Hypertension blood pressure control important in reducing the progression of atherosclerotic disease. On appropriate oral  medications.   Swelling of limb I have had a long discussion with the patient regarding swelling and why it  causes symptoms.  Patient will begin wearing graduated compression stockings class 1 (20-30 mmHg) on a daily basis a prescription was given. The patient will  beginning wearing the stockings first thing in the morning and removing them in the evening. The patient is instructed specifically not to sleep in the stockings.   In addition, behavioral modification will be initiated.  This will include frequent elevation, use of over the counter pain medications and exercise such as walking.  I have reviewed systemic causes for chronic edema such as liver, kidney and cardiac etiologies.  The patient  denies problems with these organ systems.    Consideration for a lymph pump will also be made based upon the effectiveness of conservative therapy.  This would help to improve the edema control and prevent sequela such as ulcers and infections   Patient should undergo duplex ultrasound of the venous system to ensure that DVT or reflux is not present.  The patient will follow-up with me after the ultrasound.          Festus Barren 07/25/2017, 11:15 AM   This note was created with Dragon medical transcription system.  Any errors from dictation are unintentional.

## 2017-07-25 NOTE — Patient Instructions (Signed)
Edema Edema is when you have too much fluid in your body or under your skin. Edema may make your legs, feet, and ankles swell up. Swelling is also common in looser tissues, like around your eyes. This is a common condition. It gets more common as you get older. There are many possible causes of edema. Eating too much salt (sodium) and being on your feet or sitting for a long time can cause edema in your legs, feet, and ankles. Hot weather may make edema worse. Edema is usually painless. Your skin may look swollen or shiny. Follow these instructions at home:  Keep the swollen body part raised (elevated) above the level of your heart when you are sitting or lying down.  Do not sit still or stand for a long time.  Do not wear tight clothes. Do not wear garters on your upper legs.  Exercise your legs. This can help the swelling go down.  Wear elastic bandages or support stockings as told by your doctor.  Eat a low-salt (low-sodium) diet to reduce fluid as told by your doctor.  Depending on the cause of your swelling, you may need to limit how much fluid you drink (fluid restriction).  Take over-the-counter and prescription medicines only as told by your doctor. Contact a doctor if:  Treatment is not working.  You have heart, liver, or kidney disease and have symptoms of edema.  You have sudden and unexplained weight gain. Get help right away if:  You have shortness of breath or chest pain.  You cannot breathe when you lie down.  You have pain, redness, or warmth in the swollen areas.  You have heart, liver, or kidney disease and get edema all of a sudden.  You have a fever and your symptoms get worse all of a sudden. Summary  Edema is when you have too much fluid in your body or under your skin.  Edema may make your legs, feet, and ankles swell up. Swelling is also common in looser tissues, like around your eyes.  Raise (elevate) the swollen body part above the level of your  heart when you are sitting or lying down.  Follow your doctor's instructions about diet and how much fluid you can drink (fluid restriction). This information is not intended to replace advice given to you by your health care provider. Make sure you discuss any questions you have with your health care provider. Document Released: 03/25/2008 Document Revised: 10/25/2016 Document Reviewed: 10/25/2016 Elsevier Interactive Patient Education  2017 [employer]  

## 2017-07-25 NOTE — Assessment & Plan Note (Signed)
Possible this was a nidus for starting of lymphedema.

## 2017-08-28 ENCOUNTER — Ambulatory Visit (INDEPENDENT_AMBULATORY_CARE_PROVIDER_SITE_OTHER): Payer: Medicare HMO | Admitting: Vascular Surgery

## 2017-08-28 ENCOUNTER — Ambulatory Visit (INDEPENDENT_AMBULATORY_CARE_PROVIDER_SITE_OTHER): Payer: Medicare HMO

## 2017-08-28 ENCOUNTER — Other Ambulatory Visit (INDEPENDENT_AMBULATORY_CARE_PROVIDER_SITE_OTHER): Payer: Self-pay | Admitting: Vascular Surgery

## 2017-08-28 ENCOUNTER — Encounter (INDEPENDENT_AMBULATORY_CARE_PROVIDER_SITE_OTHER): Payer: Self-pay | Admitting: Vascular Surgery

## 2017-08-28 VITALS — BP 127/70 | HR 63 | Resp 17 | Ht 65.0 in | Wt 166.0 lb

## 2017-08-28 DIAGNOSIS — I82412 Acute embolism and thrombosis of left femoral vein: Secondary | ICD-10-CM | POA: Diagnosis not present

## 2017-08-28 DIAGNOSIS — M7989 Other specified soft tissue disorders: Secondary | ICD-10-CM

## 2017-08-28 DIAGNOSIS — E785 Hyperlipidemia, unspecified: Secondary | ICD-10-CM

## 2017-08-28 DIAGNOSIS — I10 Essential (primary) hypertension: Secondary | ICD-10-CM | POA: Diagnosis not present

## 2017-08-28 DIAGNOSIS — I82409 Acute embolism and thrombosis of unspecified deep veins of unspecified lower extremity: Secondary | ICD-10-CM | POA: Insufficient documentation

## 2017-08-28 MED ORDER — APIXABAN 2.5 MG PO TABS: 5.0000 mg | ORAL_TABLET | Freq: Two times a day (BID) | ORAL | Status: DC

## 2017-08-28 MED ORDER — APIXABAN 5 MG PO TABS: 5.0000 mg | ORAL_TABLET | Freq: Two times a day (BID) | ORAL | 5 refills | Status: DC

## 2017-08-28 NOTE — Progress Notes (Signed)
Subjective:    Patient ID: Nathan Haynes, male    DOB: June 13, 1941, 76 y.o.   MRN: [id number] Chief Complaint  Patient presents with  . Follow-up    Pt. Conv. LLE vein reflux   Patient presents to review vascular studies.  The patient was seen on July 25, 2017 for evaluation of left lower extremity edema.  The symptoms are stable.  He continues to experience swelling to the left lower extremity.  The patient underwent a left lower extremity venous duplex exam was notable for evidence of a partially occlusive, mildly mobile, acute thrombus in the left common femoral vein inferior to the saphenofemoral junction.  This partially occlusive thrombus extends down through the larger of the 2 femoral veins, through the popliteal vein and into both pairs of the posterior tibial and peroneal vein to roughly the mid calf level.  The remainder of the left lower extremity veins are patent and compressible.  Patient denies any left lower extremity pain. The patient denies any shortness of breath or chest pain.  The patient denies any fever, nausea or vomiting.  The patient denies any trauma or recent surgery to the left lower extremity.  Patient denies any genetic history of bleeding or clotting disorders.   Review of Systems  Constitutional: Negative.   HENT: Negative.   Eyes: Negative.   Respiratory: Negative.   Cardiovascular: Positive for leg swelling.  Gastrointestinal: Negative.   Endocrine: Negative.   Genitourinary: Negative.   Musculoskeletal: Negative.   Skin: Negative.   Allergic/Immunologic: Negative.   Neurological: Negative.   Hematological: Negative.   Psychiatric/Behavioral: Negative.       Objective:   Physical Exam  Constitutional: He is oriented to person, place, and time. He appears well-developed and well-nourished. No distress.  HENT:  Head: Normocephalic and atraumatic.  Eyes: Conjunctivae are normal. Pupils are equal, round, and reactive to light.  Neck: Normal range  of motion.  Cardiovascular: Normal rate, regular rhythm, normal heart sounds and intact distal pulses.  Pulses:      Radial pulses are 2+ on the right side, and 2+ on the left side.       Dorsalis pedis pulses are 2+ on the right side, and 2+ on the left side.       Posterior tibial pulses are 2+ on the right side, and 2+ on the left side.  No acute vascular compromise to the left lower extremity. There is no pain with dorsiflexion. Nontender to palpation to the left lower extremity.  Pulmonary/Chest: Effort normal and breath sounds normal.  Musculoskeletal: Normal range of motion. He exhibits edema (Mild left lower extremity edema).  Neurological: He is alert and oriented to person, place, and time.  Skin: Skin is warm and dry. He is not diaphoretic.  Bilateral lower extremity: There is no erythema or cellulitis.  Skin is intact.  Psychiatric: He has a normal mood and affect. His behavior is normal. Judgment and thought content normal.  Vitals reviewed.  BP 127/70 (BP Location: Right Arm)   Pulse 63   Resp 17   Ht 5\' 5"  (1.651 m)   Wt 166 lb (75.3 kg)   BMI 27.62 kg/m   Past Medical History:  Diagnosis Date  . Gout attack   . Hypertension    Social History   Socioeconomic History  . Marital status: Married    Spouse name: Not on file  . Number of children: Not on file  . Years of education: Not on file  .  Highest education level: Not on file  Social Needs  . Financial resource strain: Not on file  . Food insecurity - worry: Not on file  . Food insecurity - inability: Not on file  . Transportation needs - medical: Not on file  . Transportation needs - non-medical: Not on file  Occupational History  . Not on file  Tobacco Use  . Smoking status: Former Smoker    Packs/day: 1.00    Types: Cigarettes  . Smokeless tobacco: Never Used  Substance and Sexual Activity  . Alcohol use: Yes    Alcohol/week: 1.2 oz    Types: 2 Glasses of wine per week  . Drug use: No  .  Sexual activity: Not on file  Other Topics Concern  . Not on file  Social History Narrative  . Not on file   Past Surgical History:  Procedure Laterality Date  . DUPUYTREN CONTRACTURE RELEASE Bilateral   . FRACTURE SURGERY Right    clavicle  . NASAL SINUS SURGERY    . SHOULDER ARTHROSCOPY Left    No family history on file.  No Known Allergies     Assessment & Plan:  Patient presents to review vascular studies.  The patient was seen on July 25, 2017 for evaluation of left lower extremity edema.  The symptoms are stable.  He continues to experience swelling to the left lower extremity.  The patient underwent a left lower extremity venous duplex exam was notable for evidence of a partially occlusive, mildly mobile, acute thrombus in the left common femoral vein inferior to the saphenofemoral junction.  This partially occlusive thrombus extends down through the larger of the 2 femoral veins, through the popliteal vein and into both pairs of the posterior tibial and peroneal vein to roughly the mid calf level.  The remainder of the left lower extremity veins are patent and compressible.  Patient denies any left lower extremity pain. The patient denies any shortness of breath or chest pain.  The patient denies any fever, nausea or vomiting.  The patient denies any trauma or recent surgery to the left lower extremity.  Patient denies any genetic history of bleeding or clotting disorders.  1. Swelling of limb - Stable Due to a left lower extremity DVT As below  2. Acute deep vein thrombosis (DVT) of femoral vein of left lower extremity (HCC) - New Patient with a finding of an acute left lower extremity DVT I will start the patient on Eliquis 5 mg one tab by mouth twice daily The patient will return in one week for repeat duplex exam to assess for any DVT propagation The patient was encouraged to seek medical attention immediately if he should experience any shortness of breath or chest  pain. Patient is not to wear compression stockings at this time as his DVT is acute Patient understands that he may be on anticoagulation for at least 6 months The patient expresses understanding  3. Hyperlipidemia, unspecified hyperlipidemia type - Stable Encouraged good control as its slows the progression of atherosclerotic disease  4. Essential hypertension - Stable Encouraged good control as its slows the progression of atherosclerotic disease  Current Outpatient Medications on File Prior to Visit  Medication Sig Dispense Refill  . atorvastatin (LIPITOR) 20 MG tablet Take 20 mg by mouth daily.    Marland Kitchen. losartan-hydrochlorothiazide (HYZAAR) 100-25 MG per tablet Take 1 tablet by mouth daily.    . metoprolol succinate (TOPROL-XL) 100 MG 24 hr tablet Take 100 mg by mouth daily. Take  with or immediately following a meal.    . oxyCODONE (OXY IR/ROXICODONE) 5 MG immediate release tablet Take 1-2 tablets (5-10 mg total) by mouth every 4 (four) hours as needed for severe pain. 60 tablet 0  . aspirin EC 81 MG tablet Take 81 mg by mouth daily.    . ondansetron (ZOFRAN) 4 MG tablet Take 1 tablet (4 mg total) by mouth every 6 (six) hours as needed for nausea or vomiting. (Patient not taking: Reported on 07/25/2017) 30 tablet 0   No current facility-administered medications on file prior to visit.    There are no Patient Instructions on file for this visit. No Follow-up on file.  Keiaira Donlan A Deniesha Stenglein, PA-C

## 2017-09-03 ENCOUNTER — Ambulatory Visit (INDEPENDENT_AMBULATORY_CARE_PROVIDER_SITE_OTHER): Payer: Medicare HMO | Admitting: Vascular Surgery

## 2017-09-03 ENCOUNTER — Ambulatory Visit (INDEPENDENT_AMBULATORY_CARE_PROVIDER_SITE_OTHER): Payer: Medicare HMO

## 2017-09-03 DIAGNOSIS — I82412 Acute embolism and thrombosis of left femoral vein: Secondary | ICD-10-CM | POA: Diagnosis not present

## 2017-09-10 ENCOUNTER — Encounter (INDEPENDENT_AMBULATORY_CARE_PROVIDER_SITE_OTHER): Payer: Self-pay

## 2017-10-03 ENCOUNTER — Encounter (INDEPENDENT_AMBULATORY_CARE_PROVIDER_SITE_OTHER): Payer: Self-pay | Admitting: Vascular Surgery

## 2017-10-03 ENCOUNTER — Ambulatory Visit (INDEPENDENT_AMBULATORY_CARE_PROVIDER_SITE_OTHER): Payer: Medicare HMO | Admitting: Vascular Surgery

## 2017-10-03 ENCOUNTER — Encounter (INDEPENDENT_AMBULATORY_CARE_PROVIDER_SITE_OTHER): Payer: Self-pay

## 2017-10-03 VITALS — BP 118/67 | HR 60 | Resp 16 | Wt 167.0 lb

## 2017-10-03 DIAGNOSIS — M7989 Other specified soft tissue disorders: Secondary | ICD-10-CM

## 2017-10-03 DIAGNOSIS — I82412 Acute embolism and thrombosis of left femoral vein: Secondary | ICD-10-CM | POA: Diagnosis not present

## 2017-10-03 DIAGNOSIS — E785 Hyperlipidemia, unspecified: Secondary | ICD-10-CM

## 2017-10-03 NOTE — Assessment & Plan Note (Signed)
Much better on anticoagulation

## 2017-10-03 NOTE — Assessment & Plan Note (Signed)
lipid control important in reducing the progression of atherosclerotic disease. Continue statin therapy  

## 2017-10-03 NOTE — Progress Notes (Signed)
MRN : [id number]  Nathan Haynes is a 76 y.o. (04-29-1941) male who presents with chief complaint of  Chief Complaint  Patient presents with  . Follow-up  .  History of Present Illness: Patient returns today in follow up of his DVT.  He has had itching from Eliquis, but has had basic resolution of his lower extremity swelling.  He denies any pain.  He is doing well today.  Current Outpatient Medications  Medication Sig Dispense Refill  . apixaban (ELIQUIS) 5 MG TABS tablet Take 1 tablet (5 mg total) 2 (two) times daily by mouth. 60 tablet 5  . aspirin EC 81 MG tablet Take 81 mg by mouth daily.    Marland Kitchen. atorvastatin (LIPITOR) 20 MG tablet Take 20 mg by mouth daily.    Marland Kitchen. losartan-hydrochlorothiazide (HYZAAR) 100-25 MG per tablet Take 1 tablet by mouth daily.    . metoprolol succinate (TOPROL-XL) 100 MG 24 hr tablet Take 100 mg by mouth daily. Take with or immediately following a meal.    . ondansetron (ZOFRAN) 4 MG tablet Take 1 tablet (4 mg total) by mouth every 6 (six) hours as needed for nausea or vomiting. (Patient not taking: Reported on 07/25/2017) 30 tablet 0  . oxyCODONE (OXY IR/ROXICODONE) 5 MG immediate release tablet Take 1-2 tablets (5-10 mg total) by mouth every 4 (four) hours as needed for severe pain. (Patient not taking: Reported on 10/03/2017) 60 tablet 0   No current facility-administered medications for this visit.     Past Medical History:  Diagnosis Date  . Gout attack   . Hypertension     Past Surgical History:  Procedure Laterality Date  . DUPUYTREN CONTRACTURE RELEASE Bilateral   . FRACTURE SURGERY Right    clavicle  . NASAL SINUS SURGERY    . SHOULDER ARTHROSCOPY Left   . SHOULDER ARTHROSCOPY WITH OPEN ROTATOR CUFF REPAIR Right 06/08/2015   Procedure: SHOULDER ARTHROSCOPY WITH OPEN ROTATOR CUFF REPAIR;  Surgeon: Juanell FairlyKevin Krasinski, MD;  Location: [facility] ORS;  Service: Orthopedics;  Laterality: Right;    Social History Social History   Tobacco Use  . Smoking  status: Former Smoker    Packs/day: 1.00    Types: Cigarettes  . Smokeless tobacco: Never Used  Substance Use Topics  . Alcohol use: Yes    Alcohol/week: 1.2 oz    Types: 2 Glasses of wine per week  . Drug use: No     Family History Family History  Problem Relation Age of Onset  . Diabetes Son      No Known Allergies    REVIEW OF SYSTEMS (Negative unless checked)  Constitutional: [] Weight loss  [] Fever  [] Chills Cardiac: [] Chest pain   [] Chest pressure   [] Palpitations   [] Shortness of breath when laying flat   [] Shortness of breath at rest   [] Shortness of breath with exertion. Vascular:  [] Pain in legs with walking   [] Pain in legs at rest   [] Pain in legs when laying flat   [] Claudication   [] Pain in feet when walking  [] Pain in feet at rest  [] Pain in feet when laying flat   [] History of DVT   [x] Phlebitis   [x] Swelling in legs   [x] Varicose veins   [] Non-healing ulcers Pulmonary:   [] Uses home oxygen   [] Productive cough   [] Hemoptysis   [] Wheeze  [] COPD   [] Asthma Neurologic:  [] Dizziness  [] Blackouts   [] Seizures   [] History of stroke   [] History of TIA  [] Aphasia   [] Temporary  blindness   [] Dysphagia   [] Weakness or numbness in arms   [] Weakness or numbness in legs Musculoskeletal:  [x] Arthritis   [] Joint swelling   [] Joint pain   [] Low back pain Hematologic:  [] Easy bruising  [] Easy bleeding   [] Hypercoagulable state   [] Anemic  [] Hepatitis Gastrointestinal:  [] Blood in stool   [] Vomiting blood  [] Gastroesophageal reflux/heartburn   [] Abdominal pain Genitourinary:  [] Chronic kidney disease   [] Difficult urination  [] Frequent urination  [] Burning with urination   [] Hematuria Skin:  [] Rashes   [] Ulcers   [] Wounds Psychological:  [] History of anxiety   []  History of major depression.    Physical Examination  BP 118/67 (BP Location: Right Arm)   Pulse 60   Resp 16   Wt 75.8 kg (167 lb)   BMI 27.79 kg/m  Gen:  WD/WN, NAD. Appears younger than stated age Head:  [place]/AT, No temporalis wasting. Ear/Nose/Throat: Hearing grossly intact, nares w/o erythema or drainage, trachea midline Eyes: Conjunctiva clear. Sclera non-icteric Neck: Supple.  No JVD.  Pulmonary:  Good air movement, no use of accessory muscles.  Cardiac: RRR, no JVD Vascular:  Vessel Right Left  Radial Palpable Palpable                          PT Palpable Palpable  DP Palpable Palpable     Musculoskeletal: M/S 5/5 throughout.  No deformity or atrophy. Trace LLE edema. Neurologic: Sensation grossly intact in extremities.  Symmetrical.  Speech is fluent.  Psychiatric: Judgment intact, Mood & affect appropriate for pt's clinical situation. Dermatologic: No rashes or ulcers noted.  No cellulitis or open wounds.       Labs No results found for this or any previous visit (from the past 2160 hour(s)).  Radiology No results found.    Assessment/Plan  Swelling of limb Much better on anticoagulation  Hyperlipidemia lipid control important in reducing the progression of atherosclerotic disease. Continue statin therapy   Deep vein thrombosis (DVT) (HCC) Symptoms are improved with treatment.  We will do a full 3924-month course of anticoagulation.  I will plan to see him back in about 4 months with a repeat duplex and at that time we can discuss when we are going to stop anticoagulation.    Festus BarrenJason Lorena Benham, MD  10/03/2017 10:57 AM    This note was created with Dragon medical transcription system.  Any errors from dictation are purely unintentional

## 2017-10-03 NOTE — Assessment & Plan Note (Signed)
Symptoms are improved with treatment.  We will do a full 7652-month course of anticoagulation.  I will plan to see him back in about 4 months with a repeat duplex and at that time we can discuss when we are going to stop anticoagulation.

## 2017-10-03 NOTE — Patient Instructions (Signed)
Deep Vein Thrombosis A deep vein thrombosis (DVT) is a blood clot (thrombus) that usually occurs in a deep, larger vein of the lower leg or the pelvis, or in an upper extremity such as the arm. These are dangerous and can lead to serious and even life-threatening complications if the clot travels to the lungs. A DVT can damage the valves in your leg veins so that instead of flowing upward, the blood pools in the lower leg. This is called post-thrombotic syndrome, and it can result in pain, swelling, discoloration, and sores on the leg. What are the causes? A DVT is caused by the formation of a blood clot in your leg, pelvis, or arm. Usually, several things contribute to the formation of blood clots. A clot may develop when:  Your blood flow slows down.  Your vein becomes damaged in some way.  You have a condition that makes your blood clot more easily.  What increases the risk? A DVT is more likely to develop in:  People who are older, especially over 60 years of age.  People who are overweight (obese).  People who sit or lie still for a long time, such as during long-distance travel (over 4 hours), bed rest, hospitalization, or during recovery from certain medical conditions like a stroke.  People who do not engage in much physical activity (sedentary lifestyle).  People who have chronic breathing disorders.  People who have a personal or family history of blood clots or blood clotting disease.  People who have peripheral vascular disease (PVD), diabetes, or some types of cancer.  People who have heart disease, especially if the person had a recent heart attack or has congestive heart failure.  People who have neurological diseases that affect the legs (leg paresis).  People who have had a traumatic injury, such as breaking a hip or leg.  People who have recently had major or lengthy surgery, especially on the hip, knee, or abdomen.  People who have had a central line placed  inside a large vein.  People who take medicines that contain the hormone estrogen. These include birth control pills and hormone replacement therapy.  Pregnancy or during childbirth or the postpartum period.  Long plane flights (over 8 hours).  What are the signs or symptoms?  Symptoms of a DVT can include:  Swelling of your leg or arm, especially if one side is much worse.  Warmth and redness of your leg or arm, especially if one side is much worse.  Pain in your arm or leg. If the clot is in your leg, symptoms may be more noticeable or worse when you stand or walk.  A feeling of pins and needles, if the clot is in the arm.  The symptoms of a DVT that has traveled to the lungs (pulmonary embolism, PE) usually start suddenly and include:  Shortness of breath while active or at rest.  Coughing or coughing up blood or blood-tinged mucus.  Chest pain that is often worse with deep breaths.  Rapid or irregular heartbeat.  Feeling light-headed or dizzy.  Fainting.  Feeling anxious.  Sweating.  There may also be pain and swelling in a leg if that is where the blood clot started. These symptoms may represent a serious problem that is an emergency. Do not wait to see if the symptoms will go away. Get medical help right away. Call your local emergency services (911 in the [place] Do not drive yourself to the hospital. How is this diagnosed? Your health   care provider will take a medical history and perform a physical exam. You may also have other tests, including:  Blood tests to assess the clotting properties of your blood.  Imaging tests, such as CT, ultrasound, MRI, X-ray, and other tests to see if you have clots anywhere in your body.  How is this treated? After a DVT is identified, it can be treated. The type of treatment that you receive depends on many factors, such as the cause of your DVT, your risk for bleeding or developing more clots, and other medical conditions that  you have. Sometimes, a combination of treatments is necessary. Treatment options may be combined and include:  Monitoring the blood clot with ultrasound.  Taking medicines by mouth, such as newer blood thinners (anticoagulants), thrombolytics, or warfarin.  Taking anticoagulant medicine by injection or through an IV tube.  Wearing compression stockings or using different types ofdevices.  Surgery (rare) to remove the blood clot or to place a filter in your abdomen to stop the blood clot from traveling to your lungs.  Treatments for a DVT are often divided into immediate treatment and long-term treatment (up to 3 months after DVT). You can work with your health care provider to choose the treatment program that is best for you. Follow these instructions at home: If you are taking a newer oral anticoagulant:  Take the medicine every single day at the same time each day.  Understand what foods and drugs interact with this medicine.  Understand that there are no regular blood tests required when using this medicine.  Understand the side effects of this medicine, including excessive bruising or bleeding. Ask your health care provider or pharmacist about other possible side effects. If you are taking warfarin:  Understand how to take warfarin and know which foods can affect how warfarin works in your body.  Understand that it is dangerous to take too much or too little warfarin. Too much warfarin increases the risk of bleeding. Too little warfarin continues to allow the risk for blood clots.  Follow your PT and INR blood testing schedule. The PT and INR results allow your health care provider to adjust your dose of warfarin. It is very important that you have your PT and INR tested as often as told by your health care provider.  Avoid major changes in your diet, or tell your health care provider before you change your diet. Arrange a visit with a registered dietitian to answer your  questions. Many foods, especially foods that are high in vitamin K, can interfere with warfarin and affect the PT and INR results. Eat a consistent amount of foods that are high in vitamin K, such as: ? Spinach, kale, broccoli, cabbage, collard greens, turnip greens, Brussels sprouts, peas, cauliflower, seaweed, and parsley. ? Beef liver and pork liver. ? Green tea. ? Soybean oil.  Tell your health care provider about any and all medicines, vitamins, and supplements that you take, including aspirin and other over-the-counter anti-inflammatory medicines. Be especially cautious with aspirin and anti-inflammatory medicines. Do not take those before you ask your health care provider if it is safe to do so. This is important because many medicines can interfere with warfarin and affect the PT and INR results.  Do not start or stop taking any over-the-counter or prescription medicine unless your health care provider or pharmacist tells you to do so. If you take warfarin, you will also need to do these things:  Hold pressure over cuts for longer than   usual.  Tell your dentist and other health care providers that you are taking warfarin before you have any procedures in which bleeding may occur.  Avoid alcohol or drink very small amounts. Tell your health care provider if you change your alcohol intake.  Do not use tobacco products, including cigarettes, chewing tobacco, and e-cigarettes. If you need help quitting, ask your health care provider.  Avoid contact sports.  General instructions  Take over-the-counter and prescription medicines only as told by your health care provider. Anticoagulant medicines can have side effects, including easy bruising and difficulty stopping bleeding. If you are prescribed an anticoagulant, you will also need to do these things: ? Hold pressure over cuts for longer than usual. ? Tell your dentist and other health care providers that you are taking anticoagulants  before you have any procedures in which bleeding may occur. ? Avoid contact sports.  Wear a medical alert bracelet or carry a medical alert card that says you have had a PE.  Ask your health care provider how soon you can go back to your normal activities. Stay active to prevent new blood clots from forming.  Make sure to exercise while traveling or when you have been sitting or standing for a long period of time. It is very important to exercise. Exercise your legs by walking or by tightening and relaxing your leg muscles often. Take frequent walks.  Wear compression stockings as told by your health care provider to help prevent more blood clots from forming.  Do not use tobacco products, including cigarettes, chewing tobacco, and e-cigarettes. If you need help quitting, ask your health care provider.  Keep all follow-up appointments with your health care provider. This is important. How is this prevented? Take these actions to decrease your risk of developing another DVT:  Exercise regularly. For at least 30 minutes every day, engage in: ? Activity that involves moving your arms and legs. ? Activity that encourages good blood flow through your body by increasing your heart rate.  Exercise your arms and legs every hour during long-distance travel (over 4 hours). Drink plenty of water and avoid drinking alcohol while traveling.  Avoid sitting or lying in bed for long periods of time without moving your legs.  Maintain a weight that is appropriate for your height. Ask your health care provider what weight is healthy for you.  If you are a woman who is over 35 years of age, avoid unnecessary use of medicines that contain estrogen. These include birth control pills.  Do not smoke, especially if you take estrogen medicines. If you need help quitting, ask your health care provider.  If you are hospitalized, prevention measures may include:  Early walking after surgery, as soon as your  health care provider says that it is safe.  Receiving anticoagulants to prevent blood clots.If you cannot take anticoagulants, other options may be available, such as wearing compression stockings or using different types of devices.  Get help right away if:  You have new or increased pain, swelling, or redness in an arm or leg.  You have numbness or tingling in an arm or leg.  You have shortness of breath while active or at rest.  You have chest pain.  You have a rapid or irregular heartbeat.  You feel light-headed or dizzy.  You cough up blood.  You notice blood in your vomit, bowel movement, or urine. These symptoms may represent a serious problem that is an emergency. Do not wait to see   if the symptoms will go away. Get medical help right away. Call your local emergency services (911 in the [place] Do not drive yourself to the hospital. This information is not intended to replace advice given to you by your health care provider. Make sure you discuss any questions you have with your health care provider. Document Released: 10/07/2005 Document Revised: 03/14/2016 Document Reviewed: 02/01/2015 Elsevier Interactive Patient Education  2017 [employer]  

## 2018-02-03 ENCOUNTER — Ambulatory Visit (INDEPENDENT_AMBULATORY_CARE_PROVIDER_SITE_OTHER): Payer: Medicare HMO | Admitting: Vascular Surgery

## 2018-02-03 ENCOUNTER — Encounter (INDEPENDENT_AMBULATORY_CARE_PROVIDER_SITE_OTHER): Payer: Self-pay | Admitting: Vascular Surgery

## 2018-02-03 ENCOUNTER — Ambulatory Visit (INDEPENDENT_AMBULATORY_CARE_PROVIDER_SITE_OTHER): Payer: Medicare HMO

## 2018-02-03 VITALS — BP 125/66 | HR 57 | Resp 17 | Ht 69.0 in | Wt 167.0 lb

## 2018-02-03 DIAGNOSIS — E785 Hyperlipidemia, unspecified: Secondary | ICD-10-CM | POA: Diagnosis not present

## 2018-02-03 DIAGNOSIS — I10 Essential (primary) hypertension: Secondary | ICD-10-CM | POA: Diagnosis not present

## 2018-02-03 DIAGNOSIS — I82412 Acute embolism and thrombosis of left femoral vein: Secondary | ICD-10-CM | POA: Diagnosis not present

## 2018-02-03 DIAGNOSIS — M7989 Other specified soft tissue disorders: Secondary | ICD-10-CM | POA: Diagnosis not present

## 2018-02-03 NOTE — Assessment & Plan Note (Signed)
blood pressure control important in reducing the progression of atherosclerotic disease. On appropriate oral medications.  

## 2018-02-03 NOTE — Assessment & Plan Note (Signed)
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 He has completed a full 3116-month course of anticoagulation.  If he develops swelling or pain, he should wear compression stockings.  I will see him back on an as-needed basis.  Continue aspirin therapy only at this point and stop Eliquis.

## 2018-02-03 NOTE — Progress Notes (Signed)
MRN : [id number]  Nathan Haynes is a 77 y.o. (Jul 23, 1941) male who presents with chief complaint of  Chief Complaint  Patient presents with  . Follow-up    4 month DVT f/u  .  History of Present Illness: Patient returns today in follow up of his DVT.  He is on blood thinners and has been for about 6 months now.  He denies any chest pain or shortness of breath.  He really does not have any significant leg swelling or pain either.  He has had no issues with the blood thinners and has tolerated them with the exception of some itching which may have been a mild allergic reaction.  This has not progressed.  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  Current Outpatient Medications  Medication Sig Dispense Refill  . apixaban (ELIQUIS) 5 MG TABS tablet Take 1 tablet (5 mg total) 2 (two) times daily by mouth. 60 tablet 5  . aspirin EC 81 MG tablet Take 81 mg by mouth daily.    Marland Kitchen. atorvastatin (LIPITOR) 20 MG tablet Take 20 mg by mouth daily.    Marland Kitchen. losartan-hydrochlorothiazide (HYZAAR) 100-25 MG per tablet Take 1 tablet by mouth daily.    . metoprolol succinate (TOPROL-XL) 100 MG 24 hr tablet Take 100 mg by mouth daily. Take with or immediately following a meal.    . ondansetron (ZOFRAN) 4 MG tablet Take 1 tablet (4 mg total) by mouth every 6 (six) hours as needed for nausea or vomiting. 30 tablet 0  . colchicine 0.6 MG tablet Take 2 tablets (1.2mg ) by mouth at first sign of gout flare followed by 1 tablet (0.6mg ) after 1 hour. (Max 1.8mg  within 1 hour)    . oxyCODONE (OXY IR/ROXICODONE) 5 MG immediate release tablet Take 1-2 tablets (5-10 mg total) by mouth every 4 (four) hours as needed for severe pain. (Patient not taking: Reported on 02/03/2018) 60 tablet 0   No current facility-administered medications for this visit.     Past Medical History:  Diagnosis Date  . DVT  (deep venous thrombosis) (HCC)   . Gout attack   . Hypertension     Past Surgical History:  Procedure Laterality Date  . DUPUYTREN CONTRACTURE RELEASE Bilateral   . FRACTURE SURGERY Right    clavicle  . NASAL SINUS SURGERY    . SHOULDER ARTHROSCOPY Left   . SHOULDER ARTHROSCOPY WITH OPEN ROTATOR CUFF REPAIR Right 06/08/2015   Procedure: SHOULDER ARTHROSCOPY WITH OPEN ROTATOR CUFF REPAIR;  Surgeon: Juanell FairlyKevin Krasinski, MD;  Location: [facility] ORS;  Service: Orthopedics;  Laterality: Right;    Social History Social History   Tobacco Use  . Smoking status: Former Smoker    Packs/day: 1.00    Types: Cigarettes  . Smokeless tobacco: Never Used  Substance Use Topics  . Alcohol use: Yes    Alcohol/week: 1.2 oz    Types: 2 Glasses of wine per week  . Drug use: No    Family History Family History  Problem Relation Age of Onset  . Diabetes Son    No Known Allergies   REVIEW OF SYSTEMS (Negative unless checked)  Constitutional: [] Weight loss  [] Fever  [] Chills Cardiac: [] Chest pain   [] Chest pressure   [] Palpitations   [] Shortness of breath when laying flat   [] Shortness of breath at rest   [] Shortness of breath with exertion. Vascular:  [] Pain  in legs with walking   [] Pain in legs at rest   [] Pain in legs when laying flat   [] Claudication   [] Pain in feet when walking  [] Pain in feet at rest  [] Pain in feet when laying flat   [x] History of DVT   [] Phlebitis   [] Swelling in legs   [] Varicose veins   [] Non-healing ulcers Pulmonary:   [] Uses home oxygen   [] Productive cough   [] Hemoptysis   [] Wheeze  [] COPD   [] Asthma Neurologic:  [] Dizziness  [] Blackouts   [] Seizures   [] History of stroke   [] History of TIA  [] Aphasia   [] Temporary blindness   [] Dysphagia   [] Weakness or numbness in arms   [] Weakness or numbness in legs Musculoskeletal:  [] Arthritis   [] Joint swelling   [] Joint pain   [] Low back pain Hematologic:  [] Easy bruising  [] Easy bleeding   [] Hypercoagulable state   [] Anemic     Gastrointestinal:  [] Blood in stool   [] Vomiting blood  [] Gastroesophageal reflux/heartburn   [] Abdominal pain Genitourinary:  [] Chronic kidney disease   [] Difficult urination  [] Frequent urination  [] Burning with urination   [] Hematuria Skin:  [] Rashes   [] Ulcers   [] Wounds Psychological:  [] History of anxiety   []  History of major depression.  Physical Examination  BP 125/66 (BP Location: Left Arm, Patient Position: Sitting)   Pulse (!) 57   Resp 17   Ht 5\' 9"  (1.753 m)   Wt 75.8 kg (167 lb)   BMI 24.66 kg/m  Gen:  WD/WN, NAD.  Appears younger than stated age Head: [place]/AT, No temporalis wasting. Ear/Nose/Throat: Hearing grossly intact, nares w/o erythema or drainage Eyes: Conjunctiva clear. Sclera non-icteric Neck: Supple.  Trachea midline Pulmonary:  Good air movement, no use of accessory muscles.  Cardiac: RRR, no JVD Vascular:  Vessel Right Left  Radial Palpable Palpable                          PT Palpable Palpable  DP Palpable Palpable   Musculoskeletal: M/S 5/5 throughout.  No deformity or atrophy.  No edema. Neurologic: Sensation grossly intact in extremities.  Symmetrical.  Speech is fluent.  Psychiatric: Judgment intact, Mood & affect appropriate for pt's clinical situation. Dermatologic: No rashes or ulcers noted.  No cellulitis or open wounds.       Labs No results found for this or any previous visit (from the past 2160 hour(s)).  Radiology No results found.  Assessment/Plan Swelling of limb Much better on anticoagulation.  Resolved at this point  Hyperlipidemia lipid control important in reducing the progression of atherosclerotic disease. Continue statin therapy  Hypertension blood pressure control important in reducing the progression of atherosclerotic disease. On appropriate oral medications.   Deep vein thrombosis (DVT) (HCC) His duplex today shows significant improvement with only mild chronic appearing DVT in the left tibial and  popliteal vein much less extensive than his previous study. He has completed a full 57-month course of anticoagulation.  If he develops swelling or pain, he should wear compression stockings.  I will see him back on an as-needed basis.  Continue aspirin therapy only at this point and stop Eliquis.     Festus Barren, MD  02/03/2018 3:21 PM    This note was created with Dragon medical transcription system.  Any errors from dictation are purely unintentional

## 2018-06-03 ENCOUNTER — Encounter: Payer: Self-pay | Admitting: Emergency Medicine

## 2018-06-03 ENCOUNTER — Ambulatory Visit
Admission: EM | Admit: 2018-06-03 | Discharge: 2018-06-03 | Disposition: A | Discharge status: Home / Self Care | Payer: Medicare HMO | Attending: Family Medicine | Admitting: Family Medicine

## 2018-06-03 ENCOUNTER — Other Ambulatory Visit: Payer: Self-pay

## 2018-06-03 DIAGNOSIS — Z23 Encounter for immunization: Secondary | ICD-10-CM

## 2018-06-03 DIAGNOSIS — S5012XA Contusion of left forearm, initial encounter: Secondary | ICD-10-CM | POA: Diagnosis not present

## 2018-06-03 DIAGNOSIS — W228XXA Striking against or struck by other objects, initial encounter: Secondary | ICD-10-CM | POA: Diagnosis not present

## 2018-06-03 DIAGNOSIS — S50812A Abrasion of left forearm, initial encounter: Secondary | ICD-10-CM | POA: Diagnosis not present

## 2018-06-03 DIAGNOSIS — T148XXA Other injury of unspecified body region, initial encounter: Secondary | ICD-10-CM

## 2018-06-03 MED ORDER — TETANUS-DIPHTH-ACELL PERTUSSIS 5-2.5-18.5 LF-MCG/0.5 IM SUSP
0.5000 mL | Freq: Once | INTRAMUSCULAR | Status: AC
  Administered 2018-06-03: 0.5 mL via INTRAMUSCULAR

## 2018-06-03 NOTE — ED Triage Notes (Signed)
Patient stated he hit his left arm on a piece of wood about 30 minutes ago and now has swelling to the area.

## 2018-06-03 NOTE — ED Provider Notes (Addendum)
[facility] ____________________________________________  Time seen: Approximately 10:50 AM  I have reviewed the triage vital signs and the nursing notes.   HISTORY  Chief Complaint Arm Injury  HPI Nathan Haynes is a 77 y.o. male presenting for evaluation of left forearm swelling that occurred approximately 30 minutes prior to arrival when he bumped into a piece of wood.  States that he was walking and accidentally bumped into this area, and states that he did not feel like he had hard however when he looked he noticed swelling to his forearm.  States minimal bleeding.  Denies any pain.  Denies paresthesias, decreased strength, decreased range of motion.  States did apply Neosporin after cleaning prior to arrival.  Unsure of last tetanus immunization, but states he thinks was approximately 30 years ago.  Denies fall to the ground, other injuries or pain.  Reports otherwise feels well denies other complaints. Denies recent sickness.   Marina GoodellFeldpausch, Dale E, MD: PCP   Past Medical History:  Diagnosis Date  . DVT (deep venous thrombosis) (HCC)   . Gout attack   . Hypertension     Patient Active Problem List   Diagnosis Date Noted  . Deep vein thrombosis (DVT) (HCC) 08/28/2017  . Hyperlipidemia 08/28/2017  . Hypertension 07/25/2017  . Swelling of limb 07/25/2017    Past Surgical History:  Procedure Laterality Date  . DUPUYTREN CONTRACTURE RELEASE Bilateral   . FRACTURE SURGERY Right    clavicle  . NASAL SINUS SURGERY    . SHOULDER ARTHROSCOPY Left   . SHOULDER ARTHROSCOPY WITH OPEN ROTATOR CUFF REPAIR Right 06/08/2015   Procedure: SHOULDER ARTHROSCOPY WITH OPEN ROTATOR CUFF REPAIR;  Surgeon: Juanell FairlyKevin Krasinski, MD;  Location: [facility] ORS;  Service: Orthopedics;  Laterality: Right;     No current facility-administered medications for this encounter.   Current Outpatient Medications:  .  aspirin EC 81 MG tablet, Take 81 mg by mouth daily., Disp: , Rfl:  .  colchicine  0.6 MG tablet, Take 2 tablets (1.2mg ) by mouth at first sign of gout flare followed by 1 tablet (0.6mg ) after 1 hour. (Max 1.8mg  within 1 hour), Disp: , Rfl:  .  losartan-hydrochlorothiazide (HYZAAR) 100-25 MG per tablet, Take 1 tablet by mouth daily., Disp: , Rfl:  .  metoprolol succinate (TOPROL-XL) 100 MG 24 hr tablet, Take 100 mg by mouth daily. Take with or immediately following a meal., Disp: , Rfl:   Allergies Patient has no known allergies.  Family History  Problem Relation Age of Onset  . Diabetes Son     Social History Social History   Tobacco Use  . Smoking status: Former Smoker    Packs/day: 1.00    Types: Cigarettes  . Smokeless tobacco: Never Used  Substance Use Topics  . Alcohol use: Yes    Alcohol/week: 2.0 standard drinks    Types: 2 Glasses of wine per week  . Drug use: No    Review of Systems Constitutional: No fever/chills Cardiovascular: Denies chest pain. Respiratory: Denies shortness of breath. Gastrointestinal: No abdominal pain.   Musculoskeletal: Negative for back pain. Skin: Negative for rash. Skin injury as above.  ____________________________________________   PHYSICAL EXAM:  VITAL SIGNS: ED Triage Vitals  Enc Vitals Group     BP 06/03/18 1033 136/86     Pulse Rate 06/03/18 1033 78     Resp 06/03/18 1033 18     Temp 06/03/18 1033 98.4 F (36.9 C)     Temp Source 06/03/18 1033 Oral  SpO2 06/03/18 1033 99 %     Weight 06/03/18 1029 165 lb (74.8 kg)     Height 06/03/18 1029 5\' 9"  (1.753 m)     Head Circumference --      Peak Flow --      Pain Score 06/03/18 1029 0     Pain Loc --      Pain Edu? --      Excl. in GC? --     Constitutional: Alert and oriented. Well appearing and in no acute distress. ENT      Head: Normocephalic and atraumatic. Cardiovascular: Normal rate, regular rhythm. Grossly normal heart sounds.  Good peripheral circulation. Respiratory: Normal respiratory effort without tachypnea nor retractions. Breath  sounds are clear and equal bilaterally. No wheezes, rales, rhonchi. Musculoskeletal:  Steady gait.  Bilateral distal radial pulses equal and easily palpated.  Left hand grip strong.  Left dorsal mid forearm hematoma noted, nontender, no bony tenderness, full range of motion present to left forearm and hand, superficial abrasion noted to hematoma, no active bleeding.  Neurologic:  Normal speech and language.Speech is normal. No gait instability.  Skin:  Skin is warm, dry.  Psychiatric: Mood and affect are normal. Speech and behavior are normal. Patient exhibits appropriate insight and judgment   ___________________________________________   LABS (all labs ordered are listed, but only abnormal results are displayed)  Labs Reviewed - No data to display  PROCEDURES Procedures   INITIAL IMPRESSION / ASSESSMENT AND PLAN / ED COURSE  Pertinent labs & imaging results that were available during my care of the patient were reviewed by me and considered in my medical decision making (see chart for details).  Well-appearing patient.  No acute distress.  Left forearm moderate sized hematoma noted to dorsal aspect, superficial abrasion, no indication for repair, tetanus immunization updated.  Wound cleaned and dressed.  Patient denies any tenderness and declines x-ray of forearm.  Mild compression dressing with Ace bandage.  Instructed to elevate and continue to apply ice, and discussed once cessation of increasing size after 2 days then to begin intermittent warm compresses.  Discussed strict follow-up and return parameters.  Discussed follow up with Primary care physician this week. Discussed follow up and return parameters including no resolution or any worsening concerns. Patient verbalized understanding and agreed to plan.   ____________________________________________   FINAL CLINICAL IMPRESSION(S) / ED DIAGNOSES  Final diagnoses:  Traumatic hematoma of left forearm, initial encounter    Abrasion     ED Discharge Orders    None       Note: This dictation was prepared with Dragon dictation along with smaller phrase technology. Any transcriptional errors that result from this process are unintentional.      Renford DillsMiller, Muhammad Vacca, NP 06/03/18 1150

## 2018-06-03 NOTE — Discharge Instructions (Signed)
Apply ice and keep clean. Elevate. As you apply ice, and the area's size stops growing, then begin applying intermittent warm compresses. Continue to monitor.   Follow up with your primary care physician this week as needed. Prompt reevaluation for pain, numbness, decreased range of motion or worsening concerns.

## 2018-07-05 ENCOUNTER — Other Ambulatory Visit: Payer: Self-pay

## 2018-07-05 ENCOUNTER — Ambulatory Visit
Admission: EM | Admit: 2018-07-05 | Discharge: 2018-07-05 | Disposition: A | Discharge status: Home / Self Care | Payer: Medicare HMO | Attending: Family Medicine | Admitting: Family Medicine

## 2018-07-05 DIAGNOSIS — I4891 Unspecified atrial fibrillation: Secondary | ICD-10-CM | POA: Diagnosis not present

## 2018-07-05 DIAGNOSIS — R002 Palpitations: Secondary | ICD-10-CM

## 2018-07-05 DIAGNOSIS — R000 Tachycardia, unspecified: Secondary | ICD-10-CM | POA: Diagnosis present

## 2018-07-05 DIAGNOSIS — Z833 Family history of diabetes mellitus: Secondary | ICD-10-CM | POA: Insufficient documentation

## 2018-07-05 DIAGNOSIS — E785 Hyperlipidemia, unspecified: Secondary | ICD-10-CM | POA: Insufficient documentation

## 2018-07-05 DIAGNOSIS — Z87891 Personal history of nicotine dependence: Secondary | ICD-10-CM | POA: Diagnosis not present

## 2018-07-05 DIAGNOSIS — I10 Essential (primary) hypertension: Secondary | ICD-10-CM | POA: Diagnosis not present

## 2018-07-05 DIAGNOSIS — Z79899 Other long term (current) drug therapy: Secondary | ICD-10-CM | POA: Insufficient documentation

## 2018-07-05 DIAGNOSIS — Z86718 Personal history of other venous thrombosis and embolism: Secondary | ICD-10-CM | POA: Insufficient documentation

## 2018-07-05 MED ORDER — APIXABAN 5 MG PO TABS: 5.0000 mg | ORAL_TABLET | Freq: Two times a day (BID) | ORAL | 1 refills | Status: AC

## 2018-07-05 NOTE — ED Provider Notes (Addendum)
[facility]    CSN: [id number] Arrival date & time: 07/05/18  1346   History   Chief Complaint Chief Complaint  Patient presents with  . Tachycardia   HPI  77 year old male with hypertension, hyperlipidemia, history of DVT presents with tachycardia.  Patient states that he has noticed that his heart rate has been high.  He has noticed this symptomatically and is also noted when he checks his blood pressure.  He feels well.  No shortness of breath.  No chest pain.  This was concerning for him and thus he came in for evaluation.  He is very concerned.  But he is feeling well.  He is currently on metoprolol.  He has no history of tachycardia or atrial fibrillation.  He was previously on Eliquis for DVT but is no longer on it.  No other associated symptoms.  No other complaints.  PMH: Hypertension, hyperlipidemia, arthritis, history of DVT  Past Surgical History:  Procedure Laterality Date  . DUPUYTREN CONTRACTURE RELEASE Bilateral   . FRACTURE SURGERY Right    clavicle  . NASAL SINUS SURGERY    . SHOULDER ARTHROSCOPY Left   . SHOULDER ARTHROSCOPY WITH OPEN ROTATOR CUFF REPAIR Right 06/08/2015   Procedure: SHOULDER ARTHROSCOPY WITH OPEN ROTATOR CUFF REPAIR;  Surgeon: Juanell FairlyKevin Krasinski, MD;  Location: [facility] ORS;  Service: Orthopedics;  Laterality: Right;     Home Medications    Prior to Admission medications   Medication Sig Start Date End Date Taking? Authorizing Provider  apixaban (ELIQUIS) 5 MG TABS tablet Take 1 tablet (5 mg total) by mouth 2 (two) times daily. 07/05/18   Tommie Samsook, Yasha Tibbett G, DO  colchicine 0.6 MG tablet Take 2 tablets (1.2mg ) by mouth at first sign of gout flare followed by 1 tablet (0.6mg ) after 1 hour. (Max 1.8mg  within 1 hour) 01/13/18   [provider]  losartan-hydrochlorothiazide (HYZAAR) 100-25 MG per tablet Take 1 tablet by mouth daily.    [provider]  metoprolol succinate (TOPROL-XL) 100 MG 24 hr tablet Take 100 mg by mouth  daily. Take with or immediately following a meal.    [provider]    Family History Family History  Problem Relation Age of Onset  . Diabetes Son     Social History Social History   Tobacco Use  . Smoking status: Former Smoker    Packs/day: 1.00    Types: Cigarettes  . Smokeless tobacco: Never Used  Substance Use Topics  . Alcohol use: Yes    Alcohol/week: 2.0 standard drinks    Types: 2 Glasses of wine per week  . Drug use: No     Allergies   Patient has no known allergies.   Review of Systems Review of Systems  Constitutional: Negative.   Respiratory: Negative.   Cardiovascular: Positive for palpitations. Negative for chest pain.   Physical Exam Triage Vital Signs ED Triage Vitals  Enc Vitals Group     BP 07/05/18 1357 (!) 144/79     Pulse Rate 07/05/18 1357 93     Resp 07/05/18 1357 20     Temp 07/05/18 1357 98.5 F (36.9 C)     Temp Source 07/05/18 1357 Oral     SpO2 07/05/18 1357 99 %     Weight 07/05/18 1358 165 lb (74.8 kg)     Height 07/05/18 1358 5\' 9"  (1.753 m)     Head Circumference --      Peak Flow --      Pain Score 07/05/18  1358 0     Pain Loc --      Pain Edu? --      Excl. in GC? --    Updated Vital Signs BP (!) 144/79 (BP Location: Right Arm)   Pulse 93   Temp 98.5 F (36.9 C) (Oral)   Resp 20   Ht 5\' 9"  (1.753 m)   Wt 74.8 kg   SpO2 99%   BMI 24.37 kg/m   Visual Acuity Right Eye Distance:   Left Eye Distance:   Bilateral Distance:    Right Eye Near:   Left Eye Near:    Bilateral Near:     Physical Exam  Constitutional: He is oriented to person, place, and time. He appears well-developed. No distress.  HENT:  Head: Normocephalic and atraumatic.  Cardiovascular:  Irregularly irregular.  Tachycardic.  Pulmonary/Chest: Effort normal and breath sounds normal. He has no wheezes. He has no rales.  Neurological: He is alert and oriented to person, place, and time.  Psychiatric: He has a normal mood and affect.  His behavior is normal.  Nursing note and vitals reviewed.  UC Treatments / Results  Labs (all labs ordered are listed, but only abnormal results are displayed) Labs Reviewed - No data to display  EKG Interpretation: H fibrillation.  Rate of 94.  No ST or T wave changes.  Radiology No results found.  Procedures Procedures (including critical care time)  Medications Ordered in UC Medications - No data to display  Initial Impression / Assessment and Plan / UC Course  I have reviewed the triage vital signs and the nursing notes.  Pertinent labs & imaging results that were available during my care of the patient were reviewed by me and considered in my medical decision making (see chart for details).    77 year old male presents with new onset atrial for ablation.  CHADS-VASc score = 3.  No rapid ventricular response at this time.  Increasing metoprolol to 100 mg daily.  Restarting Eliquis.  Advised to inform cardiology tomorrow.   Final Clinical Impressions(s) / UC Diagnoses   Final diagnoses:  New onset atrial fibrillation [facility])     Discharge Instructions     You have atrial fibrillation.  Please call Cardiology tomorrow.  Increase the metoprolol to 100 mg daily.    Start Eliquis.  Take care  Dr. Adriana Simas    ED Prescriptions    Medication Sig Dispense Auth. Provider   apixaban (ELIQUIS) 5 MG TABS tablet Take 1 tablet (5 mg total) by mouth 2 (two) times daily. 60 tablet Tommie Sams, DO     Controlled Substance Prescriptions [place] Controlled Substance Registry consulted? Not Applicable   Tommie Sams, DO 07/05/18 1429    Everlene Other G, DO 07/05/18 1430

## 2018-07-05 NOTE — Discharge Instructions (Signed)
You have atrial fibrillation.  Please call Cardiology tomorrow.  Increase the metoprolol to 100 mg daily.    Start Eliquis.  Take care  Dr. Adriana Simasook

## 2018-07-05 NOTE — ED Triage Notes (Signed)
Pt states he has had an unusually high heart rate for himself.  Usually runs in the 50's and 60's and has been around 100.  "I can feel it."

## 2018-07-29 ENCOUNTER — Ambulatory Visit: Payer: Medicare HMO | Admitting: Certified Registered Nurse Anesthetist

## 2018-07-29 ENCOUNTER — Ambulatory Visit
Admission: RE | Admit: 2018-07-29 | Discharge: 2018-07-29 | Disposition: A | Discharge status: Home / Self Care | Payer: Medicare HMO | Source: Ambulatory Visit | Attending: Cardiology | Admitting: Cardiology

## 2018-07-29 ENCOUNTER — Encounter: Payer: Self-pay | Admitting: *Deleted

## 2018-07-29 ENCOUNTER — Encounter
Admission: RE | Disposition: A | Discharge status: Home / Self Care | Payer: Self-pay | Source: Ambulatory Visit | Attending: Cardiology

## 2018-07-29 DIAGNOSIS — I10 Essential (primary) hypertension: Secondary | ICD-10-CM | POA: Insufficient documentation

## 2018-07-29 DIAGNOSIS — I480 Paroxysmal atrial fibrillation: Secondary | ICD-10-CM | POA: Insufficient documentation

## 2018-07-29 DIAGNOSIS — Z7901 Long term (current) use of anticoagulants: Secondary | ICD-10-CM | POA: Insufficient documentation

## 2018-07-29 DIAGNOSIS — I82412 Acute embolism and thrombosis of left femoral vein: Secondary | ICD-10-CM | POA: Diagnosis not present

## 2018-07-29 DIAGNOSIS — Z79899 Other long term (current) drug therapy: Secondary | ICD-10-CM | POA: Insufficient documentation

## 2018-07-29 DIAGNOSIS — E785 Hyperlipidemia, unspecified: Secondary | ICD-10-CM | POA: Insufficient documentation

## 2018-07-29 DIAGNOSIS — Z5309 Procedure and treatment not carried out because of other contraindication: Secondary | ICD-10-CM | POA: Insufficient documentation

## 2018-07-29 HISTORY — PX: CARDIOVERSION: EP1203

## 2018-07-29 SURGERY — CARDIOVERSION (CATH LAB)
Anesthesia: General

## 2018-07-29 MED ORDER — LIDOCAINE HCL (PF) 2 % IJ SOLN
INTRAMUSCULAR | Status: AC
  Filled 2018-07-29: qty 10

## 2018-07-29 MED ORDER — SODIUM CHLORIDE 0.9 % IV SOLN: INTRAVENOUS | Status: DC

## 2018-07-29 MED ORDER — PROPOFOL 10 MG/ML IV BOLUS
INTRAVENOUS | Status: AC
  Filled 2018-07-29: qty 20

## 2018-07-29 NOTE — Progress Notes (Signed)
Patient in sinus brady, dr. Darrold Junker made aware in to view ekg and procedure cancelled.

## 2018-07-30 ENCOUNTER — Encounter: Payer: Self-pay | Admitting: Cardiology

## 2019-07-15 ENCOUNTER — Other Ambulatory Visit: Payer: Self-pay | Admitting: Urology

## 2019-07-15 DIAGNOSIS — R310 Gross hematuria: Secondary | ICD-10-CM

## 2019-07-20 ENCOUNTER — Encounter (INDEPENDENT_AMBULATORY_CARE_PROVIDER_SITE_OTHER): Payer: Self-pay

## 2019-07-20 ENCOUNTER — Other Ambulatory Visit: Payer: Self-pay

## 2019-07-20 ENCOUNTER — Ambulatory Visit
Admission: RE | Admit: 2019-07-20 | Discharge: 2019-07-20 | Disposition: A | Discharge status: Home / Self Care | Payer: Medicare HMO | Source: Ambulatory Visit | Attending: Urology | Admitting: Urology

## 2019-07-20 ENCOUNTER — Other Ambulatory Visit
Admission: RE | Admit: 2019-07-20 | Discharge: 2019-07-20 | Disposition: A | Discharge status: Home / Self Care | Payer: Medicare HMO | Source: Ambulatory Visit | Attending: Urology | Admitting: Urology

## 2019-07-20 DIAGNOSIS — R310 Gross hematuria: Secondary | ICD-10-CM | POA: Diagnosis present

## 2019-07-20 LAB — CREATININE, SERUM
Creatinine, Ser: 1.15 mg/dL (ref 0.61–1.24)
GFR calc Af Amer: >60 mL/min (ref >60)
GFR calc non Af Amer: 60 mL/min — ABNORMAL LOW (ref >60)

## 2019-07-20 MED ORDER — IOHEXOL 300 MG/ML  SOLN
150.0000 mL | Freq: Once | INTRAMUSCULAR | Status: AC | PRN
  Administered 2019-07-20: 125 mL via INTRAVENOUS

## 2019-10-21 ENCOUNTER — Other Ambulatory Visit: Payer: Self-pay | Admitting: Gerontology

## 2019-10-21 DIAGNOSIS — F418 Other specified anxiety disorders: Secondary | ICD-10-CM

## 2019-10-21 DIAGNOSIS — K219 Gastro-esophageal reflux disease without esophagitis: Secondary | ICD-10-CM

## 2019-10-29 ENCOUNTER — Ambulatory Visit: Payer: Medicare HMO

## 2019-11-02 ENCOUNTER — Ambulatory Visit
Admission: RE | Admit: 2019-11-02 | Discharge: 2019-11-02 | Disposition: A | Discharge status: Home / Self Care | Payer: Medicare HMO | Source: Ambulatory Visit | Attending: Gerontology | Admitting: Gerontology

## 2019-11-02 ENCOUNTER — Other Ambulatory Visit: Payer: Self-pay

## 2019-11-02 DIAGNOSIS — K5730 Diverticulosis of large intestine without perforation or abscess without bleeding: Secondary | ICD-10-CM | POA: Insufficient documentation

## 2019-11-02 DIAGNOSIS — I700 Atherosclerosis of aorta: Secondary | ICD-10-CM | POA: Diagnosis not present

## 2019-11-02 DIAGNOSIS — N400 Enlarged prostate without lower urinary tract symptoms: Secondary | ICD-10-CM | POA: Insufficient documentation

## 2019-11-02 DIAGNOSIS — K219 Gastro-esophageal reflux disease without esophagitis: Secondary | ICD-10-CM | POA: Insufficient documentation

## 2019-11-02 DIAGNOSIS — F418 Other specified anxiety disorders: Secondary | ICD-10-CM | POA: Diagnosis not present

## 2019-11-02 MED ORDER — IOHEXOL 300 MG/ML  SOLN
100.0000 mL | Freq: Once | INTRAMUSCULAR | Status: AC | PRN
  Administered 2019-11-02: 100 mL via INTRAVENOUS

## 2019-11-08 ENCOUNTER — Other Ambulatory Visit: Payer: Self-pay | Admitting: Gastroenterology

## 2019-11-08 DIAGNOSIS — R0989 Other specified symptoms and signs involving the circulatory and respiratory systems: Secondary | ICD-10-CM

## 2019-11-11 ENCOUNTER — Other Ambulatory Visit: Payer: Self-pay

## 2019-11-11 ENCOUNTER — Ambulatory Visit
Admission: RE | Admit: 2019-11-11 | Discharge: 2019-11-11 | Disposition: A | Discharge status: Home / Self Care | Payer: Medicare HMO | Source: Ambulatory Visit | Attending: Gastroenterology | Admitting: Gastroenterology

## 2019-11-11 ENCOUNTER — Other Ambulatory Visit: Payer: Self-pay | Admitting: Gastroenterology

## 2019-11-11 DIAGNOSIS — R0989 Other specified symptoms and signs involving the circulatory and respiratory systems: Secondary | ICD-10-CM

## 2021-01-22 ENCOUNTER — Other Ambulatory Visit: Payer: Self-pay

## 2021-01-22 ENCOUNTER — Ambulatory Visit
Admission: EM | Admit: 2021-01-22 | Discharge: 2021-01-22 | Disposition: A | Discharge status: Home / Self Care | Payer: Medicare HMO | Attending: Family Medicine | Admitting: Family Medicine

## 2021-01-22 DIAGNOSIS — R5383 Other fatigue: Secondary | ICD-10-CM | POA: Diagnosis present

## 2021-01-22 HISTORY — DX: Unspecified atrial fibrillation: I48.91

## 2021-01-22 LAB — COMPREHENSIVE METABOLIC PANEL
ALT: 17 U/L (ref 0–44)
AST: 24 U/L (ref 15–41)
Albumin: 4 g/dL (ref 3.5–5.0)
Alkaline Phosphatase: 47 U/L (ref 38–126)
Anion gap: 8 (ref 5–15)
BUN: 23 mg/dL (ref 8–23)
CO2: 27 mmol/L (ref 22–32)
Calcium: 9 mg/dL (ref 8.9–10.3)
Chloride: 101 mmol/L (ref 98–111)
Creatinine, Ser: 1.39 mg/dL — ABNORMAL HIGH (ref 0.61–1.24)
GFR, Estimated: 51 mL/min — ABNORMAL LOW (ref >60)
Glucose, Bld: 109 mg/dL — ABNORMAL HIGH (ref 70–99)
Potassium: 3.6 mmol/L (ref 3.5–5.1)
Sodium: 136 mmol/L (ref 135–145)
Total Bilirubin: 0.6 mg/dL (ref 0.3–1.2)
Total Protein: 7.2 g/dL (ref 6.5–8.1)

## 2021-01-22 LAB — TROPONIN I (HIGH SENSITIVITY): Troponin I (High Sensitivity): 9 ng/L (ref <18)

## 2021-01-22 LAB — CBC WITH DIFFERENTIAL/PLATELET
Abs Immature Granulocytes: 0.02 10*3/uL (ref 0.00–0.07)
Basophils Absolute: 0.1 10*3/uL (ref 0.0–0.1)
Basophils Relative: 1 %
Eosinophils Absolute: 0.1 10*3/uL (ref 0.0–0.5)
Eosinophils Relative: 2 %
HCT: 37.8 % — ABNORMAL LOW (ref 39.0–52.0)
Hemoglobin: 12.6 g/dL — ABNORMAL LOW (ref 13.0–17.0)
Immature Granulocytes: 0 %
Lymphocytes Relative: 22 %
Lymphs Abs: 1.5 10*3/uL (ref 0.7–4.0)
MCH: 31 pg (ref 26.0–34.0)
MCHC: 33.3 g/dL (ref 30.0–36.0)
MCV: 92.9 fL (ref 80.0–100.0)
Monocytes Absolute: 0.5 10*3/uL (ref 0.1–1.0)
Monocytes Relative: 7 %
Neutro Abs: 4.7 10*3/uL (ref 1.7–7.7)
Neutrophils Relative %: 68 %
Platelets: 184 10*3/uL (ref 150–400)
RBC: 4.07 MIL/uL — ABNORMAL LOW (ref 4.22–5.81)
RDW: 13.6 % (ref 11.5–15.5)
WBC: 6.9 10*3/uL (ref 4.0–10.5)
nRBC: 0 % (ref 0.0–0.2)

## 2021-01-22 NOTE — ED Triage Notes (Addendum)
Pt c/o feeling his heart racing for about 3 days. Pt states he has had episodes of a-fib in the past and it has self-resolved. Pt states he generally feels very well but today has not. Pt is taking Flecainide and Eliquis, states he does take these every day as directed. Pt denies chest pain, shob or any other symptoms.

## 2021-01-22 NOTE — Discharge Instructions (Signed)
Rest.  Stay hydrated.   Take care  Dr. Adriana Simas

## 2021-01-24 NOTE — ED Provider Notes (Signed)
[facility]    CSN: [id number] Arrival date & time: 01/22/21  1926      History   Chief Complaint Chief Complaint  Patient presents with  . Irregular Heart Beat   HPI  80 year old male presents with the above complaint.   Patient states that he has not felt his normal self for the past 3 days.  He has felt fatigued.  He states that he has had years of time where he felt like he was in A. fib.  This prompted him to seek care today.  He endorses compliance with his home medications including flecainide, metoprolol, and Eliquis.  He is very physically active.  He played 15 holes of golf today.  He has been exerting himself without difficulty.  No chest pain.  No shortness of breath.  No other reported symptoms.  No other complaints.  Past Medical History:  Diagnosis Date  . A-fib (HCC)   . DVT (deep venous thrombosis) (HCC)   . Gout attack   . Hypertension     Patient Active Problem List   Diagnosis Date Noted  . Deep vein thrombosis (DVT) (HCC) 08/28/2017  . Hyperlipidemia 08/28/2017  . Hypertension 07/25/2017  . Swelling of limb 07/25/2017    Past Surgical History:  Procedure Laterality Date  . CARDIOVERSION N/A 07/29/2018   Procedure: CARDIOVERSION;  Surgeon: Marcina Millard, MD;  Location: [facility];  Service: Cardiovascular;  Laterality: N/A;  . DUPUYTREN CONTRACTURE RELEASE Bilateral   . FRACTURE SURGERY Right    clavicle  . NASAL SINUS SURGERY    . SHOULDER ARTHROSCOPY Left   . SHOULDER ARTHROSCOPY WITH OPEN ROTATOR CUFF REPAIR Right 06/08/2015   Procedure: SHOULDER ARTHROSCOPY WITH OPEN ROTATOR CUFF REPAIR;  Surgeon: Juanell Fairly, MD;  Location: [facility];  Service: Orthopedics;  Laterality: Right;       Home Medications    Prior to Admission medications   Medication Sig Start Date End Date Taking? Authorizing Provider  apixaban (ELIQUIS) 5 MG TABS tablet Take 1 tablet (5 mg total) by mouth 2 (two) times daily. 07/05/18  Yes Richad Ramsay G,  DO  atorvastatin (LIPITOR) 20 MG tablet Take 20 mg by mouth daily.   Yes [provider]  dutasteride (AVODART) 0.5 MG capsule Take 0.5 mg by mouth daily. 01/05/21  Yes [provider]  flecainide (TAMBOCOR) 50 MG tablet Take 50 mg by mouth 2 (two) times daily.   Yes [provider]  losartan-hydrochlorothiazide (HYZAAR) 50-12.5 MG tablet Take 1 tablet by mouth daily.   Yes [provider]  metoprolol succinate (TOPROL-XL) 100 MG 24 hr tablet Take 100 mg by mouth daily. Take with or immediately following a meal.   Yes [provider]  Saw Palmetto-Phytosterols (PROSTATE SR PO) Take 2 capsules by mouth daily.   Yes [provider]  silodosin (RAPAFLO) 8 MG CAPS capsule Take 1 capsule by mouth daily. 01/05/21  Yes [provider]  Testosterone 20 % CREA Apply 1 application topically daily.   Yes [provider]    Family History Family History  Problem Relation Age of Onset  . Diabetes Son     Social History Social History   Tobacco Use  . Smoking status: Former Smoker    Packs/day: 1.00    Types: Cigarettes  . Smokeless tobacco: Never Used  Vaping Use  . Vaping Use: Never used  Substance Use Topics  . Alcohol use: Yes    Alcohol/week: 2.0 standard drinks    Types:  2 Glasses of wine per week  . Drug use: No     Allergies   Patient has no known allergies.   Review of Systems Review of Systems  Constitutional: Positive for fatigue.  Respiratory: Negative.   Cardiovascular: Positive for palpitations. Negative for chest pain.   Physical Exam Triage Vital Signs ED Triage Vitals  Enc Vitals Group     BP 01/22/21 1939 (!) 141/65     Pulse Rate 01/22/21 1939 60     Resp 01/22/21 1939 18     Temp 01/22/21 1939 98.3 F (36.8 C)     Temp Source 01/22/21 1939 Oral     SpO2 01/22/21 1939 99 %     Weight 01/22/21 1937 160 lb (72.6 kg)     Height 01/22/21 1937 5\' 9"  (1.753 m)     Head Circumference --       Peak Flow --      Pain Score 01/22/21 1937 0     Pain Loc --      Pain Edu? --      Excl. in GC? --    Updated Vital Signs BP (!) 141/65 (BP Location: Left Arm)   Pulse 60   Temp 98.3 F (36.8 C) (Oral)   Resp 18   Ht 5\' 9"  (1.753 m)   Wt 72.6 kg   SpO2 99%   BMI 23.63 kg/m   Visual Acuity Right Eye Distance:   Left Eye Distance:   Bilateral Distance:    Right Eye Near:   Left Eye Near:    Bilateral Near:     Physical Exam Vitals and nursing note reviewed.  Constitutional:      General: He is not in acute distress.    Appearance: Normal appearance. He is not ill-appearing.  HENT:     Head: Normocephalic and atraumatic.  Eyes:     General:        Right eye: No discharge.        Left eye: No discharge.     Conjunctiva/sclera: Conjunctivae normal.  Cardiovascular:     Rate and Rhythm: Normal rate and regular rhythm.  Pulmonary:     Effort: Pulmonary effort is normal.     Breath sounds: Normal breath sounds. No wheezing, rhonchi or rales.  Neurological:     Mental Status: He is alert.  Psychiatric:        Mood and Affect: Mood normal.        Behavior: Behavior normal.    UC Treatments / Results  Labs (all labs ordered are listed, but only abnormal results are displayed) Labs Reviewed  CBC WITH DIFFERENTIAL/PLATELET - Abnormal; Notable for the following components:      Result Value   RBC 4.07 (*)    Hemoglobin 12.6 (*)    HCT 37.8 (*)    All other components within normal limits  COMPREHENSIVE METABOLIC PANEL - Abnormal; Notable for the following components:   Glucose, Bld 109 (*)    Creatinine, Ser 1.39 (*)    GFR, Estimated 51 (*)    All other components within normal limits  TROPONIN I (HIGH SENSITIVITY)  TROPONIN I (HIGH SENSITIVITY)    EKG Interpretation: Normal sinus rhythm at the rate of 61. Left axis deviation. No ST or T wave changes.   Radiology No results found.  Procedures Procedures (including critical care time)  Medications  Ordered in UC Medications - No data to display  Initial Impression / Assessment and Plan / UC Course  I have reviewed the triage vital signs and the nursing notes.  Pertinent labs & imaging results that were available during my care of the patient were reviewed by me and considered in my medical decision making (see chart for details).    80 year old male presents with fatigue.  He has felt as if he has been in A. fib.  Currently in normal sinus rhythm.  Laboratory studies obtained and were unremarkable.  Negative troponin.  Advised to continue his current medications.  Stay hydrated.  Follow-up with cardiology.  Final Clinical Impressions(s) / UC Diagnoses   Final diagnoses:  Fatigue, unspecified type     Discharge Instructions     Rest.  Stay hydrated.   Take care  Dr. Adriana Simas    ED Prescriptions    None     PDMP not reviewed this encounter.   Tommie Sams, [place] 01/24/21 [phone]

## 2021-01-28 ENCOUNTER — Other Ambulatory Visit: Payer: Self-pay

## 2021-01-28 ENCOUNTER — Ambulatory Visit
Admission: EM | Admit: 2021-01-28 | Discharge: 2021-01-28 | Disposition: A | Discharge status: Home / Self Care | Payer: Medicare HMO | Attending: Family Medicine | Admitting: Family Medicine

## 2021-01-28 DIAGNOSIS — I10 Essential (primary) hypertension: Secondary | ICD-10-CM

## 2021-01-28 MED ORDER — AMLODIPINE BESYLATE 5 MG PO TABS: 5.0000 mg | ORAL_TABLET | Freq: Every day | ORAL | 1 refills | Status: DC

## 2021-01-28 NOTE — Discharge Instructions (Signed)
Medication as prescribed.  Follow up with Cardiology. Call for an appt.  Take care  Dr. Adriana Simas

## 2021-01-28 NOTE — ED Triage Notes (Signed)
Patient states that he did not feel well when he woke up and checked BP and it was elevated. States that he has continued to check BP and has been elevated. Reports that he has also been dizzy. Highest reading was 166/76

## 2021-01-28 NOTE — ED Provider Notes (Signed)
[facility]    CSN: [id number] Arrival date & time: 01/28/21  1200      History   Chief Complaint Chief Complaint  Patient presents with  . Hypertension   HPI   80 year old male presents with the above complaint.  Patient reports that he woke up this morning and did not feel well.  He has had a headache.  He also states that his stomach has been upset and that he has been dizzy.  He states that the symptoms are typical for him when he has elevated blood pressure.  He has taken his blood pressure multiple times and it has been elevated.  Highest reading has been 166/76.  This is very concerning to him.  He endorses compliance with his medications.  No chest pain.  No other reported symptoms.  No other complaints.  Past Medical History:  Diagnosis Date  . A-fib (HCC)   . DVT (deep venous thrombosis) (HCC)   . Gout attack   . Hypertension     Patient Active Problem List   Diagnosis Date Noted  . Deep vein thrombosis (DVT) (HCC) 08/28/2017  . Hyperlipidemia 08/28/2017  . Hypertension 07/25/2017  . Swelling of limb 07/25/2017    Past Surgical History:  Procedure Laterality Date  . CARDIOVERSION N/A 07/29/2018   Procedure: CARDIOVERSION;  Surgeon: Marcina Millard, MD;  Location: [facility] OR[facility];  Service: Cardiovascular;  Laterality: N/A;  . DUPUYTREN CONTRACTURE RELEASE Bilateral   . FRACTURE SURGERY Right    clavicle  . NASAL SINUS SURGERY    . SHOULDER ARTHROSCOPY Left   . SHOULDER ARTHROSCOPY WITH OPEN ROTATOR CUFF REPAIR Right 06/08/2015   Procedure: SHOULDER ARTHROSCOPY WITH OPEN ROTATOR CUFF REPAIR;  Surgeon: Juanell Fairly, MD;  Location: [facility];  Service: Orthopedics;  Laterality: Right;       Home Medications    Prior to Admission medications   Medication Sig Start Date End Date Taking? Authorizing Provider  amLODipine (NORVASC) 5 MG tablet Take 1 tablet (5 mg total) by mouth daily. 01/28/21  Yes Paysen Goza G, DO  apixaban (ELIQUIS) 5 MG TABS  tablet Take 1 tablet (5 mg total) by mouth 2 (two) times daily. 07/05/18  Yes Tashayla Therien G, DO  atorvastatin (LIPITOR) 20 MG tablet Take 20 mg by mouth daily.   Yes [provider]  dutasteride (AVODART) 0.5 MG capsule Take 0.5 mg by mouth daily. 01/05/21  Yes [provider]  flecainide (TAMBOCOR) 50 MG tablet Take 50 mg by mouth 2 (two) times daily.   Yes [provider]  losartan-hydrochlorothiazide (HYZAAR) 50-12.5 MG tablet Take 1 tablet by mouth daily.   Yes [provider]  metoprolol succinate (TOPROL-XL) 100 MG 24 hr tablet Take 100 mg by mouth daily. Take with or immediately following a meal.   Yes [provider]  Saw Palmetto-Phytosterols (PROSTATE SR PO) Take 2 capsules by mouth daily.   Yes [provider]  silodosin (RAPAFLO) 8 MG CAPS capsule Take 1 capsule by mouth daily. 01/05/21  Yes [provider]  Testosterone 20 % CREA Apply 1 application topically daily.   Yes [provider]    Family History Family History  Problem Relation Age of Onset  . Diabetes Son     Social History Social History   Tobacco Use  . Smoking status: Former Smoker    Packs/day: 1.00    Types: Cigarettes  . Smokeless tobacco: Never Used  Vaping Use  . Vaping Use: Never used  Substance  Use Topics  . Alcohol use: Yes    Alcohol/week: 2.0 standard drinks    Types: 2 Glasses of wine per week  . Drug use: No     Allergies   Patient has no known allergies.   Review of Systems Review of Systems  Neurological: Positive for dizziness and headaches.   Physical Exam Triage Vital Signs ED Triage Vitals  Enc Vitals Group     BP 01/28/21 1209 (!) 164/76     Pulse Rate 01/28/21 1209 (!) 57     Resp 01/28/21 1209 18     Temp 01/28/21 1209 98 F (36.7 C)     Temp Source 01/28/21 1209 Oral     SpO2 01/28/21 1209 100 %     Weight 01/28/21 1207 160 lb 0.9 oz (72.6 kg)     Height 01/28/21 1207 5\' 9"  (1.753 m)     Head  Circumference --      Peak Flow --      Pain Score 01/28/21 1207 0     Pain Loc --      Pain Edu? --      Excl. in GC? --    Updated Vital Signs BP (!) 164/76 (BP Location: Left Arm)   Pulse (!) 57   Temp 98 F (36.7 C) (Oral)   Resp 18   Ht 5\' 9"  (1.753 m)   Wt 72.6 kg   SpO2 100%   BMI 23.64 kg/m   Visual Acuity Right Eye Distance:   Left Eye Distance:   Bilateral Distance:    Right Eye Near:   Left Eye Near:    Bilateral Near:     Physical Exam   UC Treatments / Results  Labs (all labs ordered are listed, but only abnormal results are displayed) Labs Reviewed - No data to display  EKG   Radiology No results found.  Procedures Procedures (including critical care time)  Medications Ordered in UC Medications - No data to display  Initial Impression / Assessment and Plan / UC Course  I have reviewed the triage vital signs and the nursing notes.  Pertinent labs & imaging results that were available during my care of the patient were reviewed by me and considered in my medical decision making (see chart for details).    80 year old male presents with hypertension.  Chronic illness with exacerbation. Currently uncontrolled.  Blood pressure readings elevated.  Adding amlodipine.   Final Clinical Impressions(s) / UC Diagnoses   Final diagnoses:  Essential hypertension     Discharge Instructions     Medication as prescribed.  Follow up with Cardiology. Call for an appt.  Take care  Dr.    ED Prescriptions    Medication Sig Dispense Auth. Provider   amLODipine (NORVASC) 5 MG tablet Take 1 tablet (5 mg total) by mouth daily. 90 tablet 97 G, DO     PDMP not reviewed this encounter.   Adriana Simas, Everlene Other 01/28/21 1345

## 2021-03-04 ENCOUNTER — Encounter: Payer: Self-pay | Admitting: Emergency Medicine

## 2021-03-04 ENCOUNTER — Other Ambulatory Visit: Payer: Self-pay

## 2021-03-04 ENCOUNTER — Ambulatory Visit
Admission: EM | Admit: 2021-03-04 | Discharge: 2021-03-04 | Disposition: A | Discharge status: Home / Self Care | Payer: Medicare HMO | Attending: Sports Medicine | Admitting: Sports Medicine

## 2021-03-04 DIAGNOSIS — I10 Essential (primary) hypertension: Secondary | ICD-10-CM | POA: Insufficient documentation

## 2021-03-04 DIAGNOSIS — R251 Tremor, unspecified: Secondary | ICD-10-CM | POA: Diagnosis not present

## 2021-03-04 DIAGNOSIS — Z20822 Contact with and (suspected) exposure to covid-19: Secondary | ICD-10-CM | POA: Diagnosis not present

## 2021-03-04 DIAGNOSIS — R110 Nausea: Secondary | ICD-10-CM | POA: Diagnosis present

## 2021-03-04 DIAGNOSIS — F411 Generalized anxiety disorder: Secondary | ICD-10-CM | POA: Insufficient documentation

## 2021-03-04 DIAGNOSIS — R030 Elevated blood-pressure reading, without diagnosis of hypertension: Secondary | ICD-10-CM

## 2021-03-04 DIAGNOSIS — Z86718 Personal history of other venous thrombosis and embolism: Secondary | ICD-10-CM | POA: Diagnosis not present

## 2021-03-04 DIAGNOSIS — Z87891 Personal history of nicotine dependence: Secondary | ICD-10-CM | POA: Diagnosis not present

## 2021-03-04 DIAGNOSIS — Z7901 Long term (current) use of anticoagulants: Secondary | ICD-10-CM | POA: Insufficient documentation

## 2021-03-04 LAB — RESP PANEL BY RT-PCR (FLU A&B, COVID) ARPGX2
Influenza A by PCR: NEGATIVE
Influenza B by PCR: NEGATIVE
SARS Coronavirus 2 by RT PCR: NEGATIVE

## 2021-03-04 MED ORDER — ONDANSETRON HCL 4 MG PO TABS: 4.0000 mg | ORAL_TABLET | Freq: Four times a day (QID) | ORAL | 0 refills | Status: DC

## 2021-03-04 NOTE — ED Triage Notes (Signed)
Pt c/o elevated blood pressure, shaking, and vomiting. He states he took his BP at home 190/80 and pulse was 90. He denies chest pain, shortness of breath or dizziness.

## 2021-03-04 NOTE — Discharge Instructions (Signed)
Your respiratory panel revealed that you do not have COVID and you do not have influenza. Your examination was reassuring.  Your blood pressure is normal.  Your temperature is mildly elevated but does not meet the definition of a fever.  Please monitor this. I did send in a medication for your nausea and you can get that at your pharmacy. Please contact your primary care provider and get an appointment to be seen if your symptoms persist. If your symptoms were to worsen then please go to the emergency room.  You would need a higher level of care that we cannot provide at our facility.

## 2021-03-04 NOTE — ED Provider Notes (Signed)
[facility]    CSN: [id number] Arrival date & time: 03/04/21  1353      History   Chief Complaint Chief Complaint  Patient presents with  . Hypertension  . Emesis    HPI Nathan Haynes is a 80 y.o. male.   Patient is an 80 year old male who presents for evaluation of the above issue.  He normally sees Dr. Maryjane Hurter at [facility] in [place].  He was unable to see him today.  Patient presents because of concern about his blood pressure being elevated at home.  I reviewed his chart in detail.  It appears as though he has had concerns about this in the past.  Actually saw Dr. Adriana Simas recently and he was prescribed amlodipine in addition to his other blood pressure medications.  He reports no side effects from that medication.  He reports his blood pressure was significantly elevated at home.  He said he had some shakes.  He also reports that he had nausea and one episode of emesis.  No blood or bile.  He denies any significant chest pain or shortness of breath.  No dizziness or diaphoresis.  No jaw or arm pain.  He has been vaccinated against COVID x3 including the booster.  No flu shot.  No COVID exposure or COVID history.  Again, I reviewed his chart in detail.  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  He reports no bleeding.  No blood in the urine or stool.  No red flag signs or symptoms elicited on history.     Past Medical History:  Diagnosis Date  . A-fib (HCC)   . DVT (deep venous thrombosis) (HCC)   . Gout attack   . Hypertension     Patient Active Problem List   Diagnosis Date Noted  . Deep vein thrombosis (DVT) (HCC) 08/28/2017  . Hyperlipidemia 08/28/2017  . Hypertension 07/25/2017  . Swelling of limb 07/25/2017    Past Surgical History:  Procedure Laterality Date  . CARDIOVERSION N/A 07/29/2018   Procedure: CARDIOVERSION;  Surgeon: Marcina Millard, MD;  Location: [facility] ORS;  Service: Cardiovascular;  Laterality: N/A;  . DUPUYTREN  CONTRACTURE RELEASE Bilateral   . FRACTURE SURGERY Right    clavicle  . NASAL SINUS SURGERY    . SHOULDER ARTHROSCOPY Left   . SHOULDER ARTHROSCOPY WITH OPEN ROTATOR CUFF REPAIR Right 06/08/2015   Procedure: SHOULDER ARTHROSCOPY WITH OPEN ROTATOR CUFF REPAIR;  Surgeon: Juanell Fairly, MD;  Location: [facility] ORS;  Service: Orthopedics;  Laterality: Right;       Home Medications    Prior to Admission medications   Medication Sig Start Date End Date Taking? Authorizing Provider  amLODipine (NORVASC) 5 MG tablet Take 1 tablet (5 mg total) by mouth daily. 01/28/21  Yes Cook, Jayce G, DO  apixaban (ELIQUIS) 5 MG TABS tablet Take 1 tablet (5 mg total) by mouth 2 (two) times daily. 07/05/18  Yes Cook, Jayce G, DO  atorvastatin (LIPITOR) 20 MG tablet Take 20 mg by mouth daily.   Yes [provider]  dutasteride (AVODART) 0.5 MG capsule Take 0.5 mg by mouth daily. 01/05/21  Yes [provider]  flecainide (TAMBOCOR) 50 MG tablet Take 50 mg by mouth 2 (two) times daily.   Yes [provider]  losartan-hydrochlorothiazide (HYZAAR) 50-12.5 MG tablet Take 1 tablet by mouth daily.   Yes [provider]  metoprolol succinate (TOPROL-XL) 100 MG 24 hr tablet Take 100 mg by mouth daily. Take  with or immediately following a meal.   Yes [provider]  ondansetron (ZOFRAN) 4 MG tablet Take 1 tablet (4 mg total) by mouth every 6 (six) hours. 03/04/21  Yes Delton SeeBarnes, Ramie Palladino, MD  Saw Palmetto-Phytosterols (PROSTATE SR PO) Take 2 capsules by mouth daily.   Yes [provider]  silodosin (RAPAFLO) 8 MG CAPS capsule Take 1 capsule by mouth daily. 01/05/21  Yes [provider]  Testosterone 20 % CREA Apply 1 application topically daily.   Yes [provider]    Family History Family History  Problem Relation Age of Onset  . Diabetes Son     Social History Social History   Tobacco Use  . Smoking status: Former Smoker    Packs/day: 1.00     Types: Cigarettes  . Smokeless tobacco: Never Used  Vaping Use  . Vaping Use: Never used  Substance Use Topics  . Alcohol use: Yes    Alcohol/week: 2.0 standard drinks    Types: 2 Glasses of wine per week  . Drug use: No     Allergies   Patient has no known allergies.   Review of Systems Review of Systems  Constitutional: Negative.  Negative for activity change, appetite change, chills, diaphoresis and fever.  HENT: Negative.  Negative for congestion, ear discharge, ear pain, rhinorrhea, sinus pressure, sinus pain and sore throat.   Eyes: Negative.  Negative for pain.  Respiratory: Negative.  Negative for cough, chest tightness, shortness of breath and wheezing.   Cardiovascular: Negative.  Negative for chest pain and palpitations.  Gastrointestinal: Positive for nausea and vomiting. Negative for abdominal pain, blood in stool, constipation and diarrhea.  Genitourinary: Negative for dysuria and flank pain.  Musculoskeletal: Negative.  Negative for arthralgias, back pain, myalgias and neck stiffness.  Skin: Negative.  Negative for color change, pallor, rash and wound.  Neurological: Positive for tremors. Negative for dizziness, seizures, syncope, speech difficulty, weakness, light-headedness, numbness and headaches.  Hematological: Negative for adenopathy. Does not bruise/bleed easily.  Psychiatric/Behavioral: The patient is nervous/anxious.   All other systems reviewed and are negative.    Physical Exam Triage Vital Signs ED Triage Vitals  Enc Vitals Group     BP 03/04/21 1421 (!) 132/58     Pulse Rate 03/04/21 1421 83     Resp 03/04/21 1421 20     Temp 03/04/21 1421 100.1 F (37.8 C)     Temp Source 03/04/21 1421 Oral     SpO2 03/04/21 1421 97 %     Weight 03/04/21 1422 160 lb 0.9 oz (72.6 kg)     Height 03/04/21 1422 5\' 9"  (1.753 m)     Head Circumference --      Peak Flow --      Pain Score 03/04/21 1422 4     Pain Loc --      Pain Edu? --      Excl. in GC? --     No data found.  Updated Vital Signs BP (!) 132/58 (BP Location: Left Arm)   Pulse 83   Temp 100.1 F (37.8 C) (Oral)   Resp 20   Ht 5\' 9"  (1.753 m)   Wt 72.6 kg   SpO2 97%   BMI 23.64 kg/m   Visual Acuity Right Eye Distance:   Left Eye Distance:   Bilateral Distance:    Right Eye Near:   Left Eye Near:    Bilateral Near:     Physical Exam Vitals and nursing note reviewed.  Constitutional:      General: He is not in acute distress.    Appearance: Normal appearance. He is not ill-appearing, toxic-appearing or diaphoretic.  HENT:     Head: Normocephalic and atraumatic.     Nose: Nose normal. No congestion or rhinorrhea.     Mouth/Throat:     Mouth: Mucous membranes are moist.     Pharynx: No oropharyngeal exudate or posterior oropharyngeal erythema.  Eyes:     General: No scleral icterus.       Right eye: No discharge.        Left eye: No discharge.     Conjunctiva/sclera: Conjunctivae normal.     Pupils: Pupils are equal, round, and reactive to light.  Cardiovascular:     Rate and Rhythm: Normal rate and regular rhythm.     Pulses: Normal pulses.     Heart sounds: Normal heart sounds.  Pulmonary:     Effort: Pulmonary effort is normal. No respiratory distress.     Breath sounds: Normal breath sounds. No stridor. No wheezing, rhonchi or rales.  Abdominal:     General: Abdomen is flat. Bowel sounds are normal. There is no distension.     Palpations: Abdomen is soft.     Tenderness: There is no abdominal tenderness. There is no right CVA tenderness, left CVA tenderness, guarding or rebound.  Musculoskeletal:     Cervical back: Normal range of motion and neck supple. No rigidity or tenderness.  Lymphadenopathy:     Cervical: No cervical adenopathy.  Skin:    General: Skin is warm and dry.     Capillary Refill: Capillary refill takes less than 2 seconds.     Findings: No bruising, erythema, lesion or rash.  Neurological:     General: No focal deficit present.      Mental Status: He is alert and oriented to person, place, and time.  Psychiatric:        Mood and Affect: Mood is anxious.      UC Treatments / Results  Labs (all labs ordered are listed, but only abnormal results are displayed) Labs Reviewed  RESP PANEL BY RT-PCR (FLU A&B, COVID) [id number]    EKG Twelve-lead EKG performed in the office ordered and interpreted by myself Mar 04, 2021 at 2:25 PM showed a ventricular rate of 84 bpm.  PR interval of 202 ms.  QRS duration of 102 ms.  QT and QTc corrected of 356 and 420 ms.  No acute ST or T wave changes.  Will await cardiology over read.  Radiology No results found.  Procedures Procedures (including critical care time)  Medications Ordered in UC Medications - No data to display  Initial Impression / Assessment and Plan / UC Course  I have reviewed the triage vital signs and the nursing notes.  Pertinent labs & imaging results that were available during my care of the patient were reviewed by me and considered in my medical decision making (see chart for details).  Clinical impression: 80 year old male who presents for concern about elevation of blood pressure at home and what he calls some shakiness.  He also has some associated nausea and one episode of emesis.  His physical exam and vitals are very reassuring.  Treatment plan: 1.  The findings and treatment plan were discussed in detail with the patient.  Patient was in agreement. 2.  Given his complaints I went ahead and did a twelve-lead EKG.  Results are above.  It does show normal sinus rhythm.  No acute ST or T wave changes seen. 3.  Went ahead and got a respiratory panel.  It was negative for COVID and influenza. 4.  Given his subjective nausea I went ahead and gave him Zofran sent to his pharmacy.  He can pick that up. 5.  Educational handouts provided. 6.  Clearly he is perseverating on his blood pressure and should see his primary care provider just to reassure him  that it is optimally controlled.  This clearly is getting him into a state of anxiety and he has a significant anxious mood on presentation.  We discussed controlling that and strategies at home.  He voiced verbal understanding. 7.  Certainly if his symptoms were to worsen to the point where he is more concerned he should go to the ER or call 911. 8.  He was discharged from care in stable condition and he will follow-up here as needed.    Final Clinical Impressions(s) / UC Diagnoses   Final diagnoses:  Elevated blood pressure, situational  Nausea without vomiting  Tremors of nervous system  Anxiety state     Discharge Instructions     Your respiratory panel revealed that you do not have COVID and you do not have influenza. Your examination was reassuring.  Your blood pressure is normal.  Your temperature is mildly elevated but does not meet the definition of a fever.  Please monitor this. I did send in a medication for your nausea and you can get that at your pharmacy. Please contact your primary care provider and get an appointment to be seen if your symptoms persist. If your symptoms were to worsen then please go to the emergency room.  You would need a higher level of care that we cannot provide at our facility.    ED Prescriptions    Medication Sig Dispense Auth. Provider   ondansetron (ZOFRAN) 4 MG tablet Take 1 tablet (4 mg total) by mouth every 6 (six) hours. 12 tablet Delton See, MD     PDMP not reviewed this encounter.   Delton See, MD 03/05/21 [phone]

## 2022-08-19 ENCOUNTER — Other Ambulatory Visit: Payer: Self-pay | Admitting: Urology

## 2022-08-19 DIAGNOSIS — R319 Hematuria, unspecified: Secondary | ICD-10-CM

## 2022-08-28 ENCOUNTER — Ambulatory Visit
Admission: RE | Admit: 2022-08-28 | Discharge: 2022-08-28 | Disposition: A | Discharge status: Home / Self Care | Payer: Medicare HMO | Source: Ambulatory Visit | Attending: Urology | Admitting: Urology

## 2022-08-28 DIAGNOSIS — R319 Hematuria, unspecified: Secondary | ICD-10-CM | POA: Insufficient documentation

## 2022-08-28 LAB — POCT I-STAT CREATININE: Creatinine, Ser: 1.4 mg/dL — ABNORMAL HIGH (ref 0.61–1.24)

## 2022-08-28 MED ORDER — IOHEXOL 300 MG/ML  SOLN
100.0000 mL | Freq: Once | INTRAMUSCULAR | Status: AC | PRN
  Administered 2022-08-28: 100 mL via INTRAVENOUS

## 2023-01-22 ENCOUNTER — Telehealth: Payer: Self-pay

## 2023-01-22 NOTE — Telephone Encounter (Signed)
Courtesy called placed to NP.  Reviewed referral with patient, informed her of appt details and Location. All question and concerns answered.

## 2023-01-23 ENCOUNTER — Inpatient Hospital Stay: Payer: Medicare HMO | Attending: Oncology | Admitting: Oncology

## 2023-01-23 ENCOUNTER — Encounter: Payer: Self-pay | Admitting: Oncology

## 2023-01-23 ENCOUNTER — Inpatient Hospital Stay: Payer: Medicare HMO

## 2023-01-23 VITALS — BP 145/81 | HR 55 | Temp 97.4°F | Resp 18 | Wt 162.3 lb

## 2023-01-23 DIAGNOSIS — I824Y1 Acute embolism and thrombosis of unspecified deep veins of right proximal lower extremity: Secondary | ICD-10-CM

## 2023-01-23 DIAGNOSIS — Z79899 Other long term (current) drug therapy: Secondary | ICD-10-CM | POA: Insufficient documentation

## 2023-01-23 DIAGNOSIS — Z7901 Long term (current) use of anticoagulants: Secondary | ICD-10-CM | POA: Insufficient documentation

## 2023-01-23 DIAGNOSIS — Z79890 Hormone replacement therapy (postmenopausal): Secondary | ICD-10-CM | POA: Insufficient documentation

## 2023-01-23 DIAGNOSIS — I82401 Acute embolism and thrombosis of unspecified deep veins of right lower extremity: Secondary | ICD-10-CM | POA: Insufficient documentation

## 2023-01-23 NOTE — Progress Notes (Signed)
Pt here to establish care for DVT

## 2023-01-23 NOTE — Assessment & Plan Note (Signed)
Thrombosis of RLE, unclear superficial vs deep. Records form [place] not available.   Likely triggered by  post long-haul flight, possible delay of his eliquis dose during his trip, testosterone use.  Repeat ultrasound of the right lower leg to determine if the clot is resolving and to differentiate between deep vein thrombosis and superficial vein thrombosis. Check factor 5 leiden mutation, prothrombin gene mutation.  Continue Eliquis 5mg  BID for now.

## 2023-01-23 NOTE — Assessment & Plan Note (Signed)
Possible association between testosterone use and increased thrombosis risk. Recommend patient to discuss with urologist about reducing the dose or discontinuing testosterone cream, especially in light of elevated testosterone levels (940) and recent thrombosis.

## 2023-01-23 NOTE — Progress Notes (Signed)
Hematology/Oncology Consult note [phone] [phone]        REFERRING PROVIDER: Sofie Hartigan, MD   CHIEF COMPLAINTS/REASON FOR VISIT:  Evaluation of right LE thrombosis   ASSESSMENT & PLAN:   Deep vein thrombosis (DVT) Thrombosis of RLE, unclear superficial vs deep. Records form [place] not available.   Likely triggered by  post long-haul flight, possible delay of his eliquis dose during his trip, testosterone use.  Repeat ultrasound of the right lower leg to determine if the clot is resolving and to differentiate between deep vein thrombosis and superficial vein thrombosis. Check factor 5 leiden mutation, prothrombin gene mutation.  Continue Eliquis 5mg  BID for now.   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 Recommend patient to discuss with urologist about reducing the dose or discontinuing testosterone cream, especially in light of elevated testosterone levels (940) and recent thrombosis.   Orders Placed This Encounter  Procedures   US Venous Img Lower Unilateral Right    Standing Status:   Future    Number of Occurrences:   1    Standing Expiration Date:   01/23/2024    Order Specific Question:   Reason for Exam (SYMPTOM  OR DIAGNOSIS REQUIRED)    Answer:   [place] deep vein thrombosis    Order Specific Question:   Preferred imaging location?    Answer:   [facility]   Factor 5 leiden    Standing Status:   Future    Number of Occurrences:   1    Standing Expiration Date:   01/23/2024   Prothrombin gene mutation    Standing Status:   Future    Number of Occurrences:   1    Standing Expiration Date:   01/23/2024   Follow up in 3 week to review results and plan.  All questions were answered. The patient knows to call the clinic with any problems, questions or concerns.  Nathan Server, MD, PhD [facility] Hematology Oncology 01/23/2023   HISTORY OF  PRESENTING ILLNESS:   Nathan Haynes is a  82 y.o.  male with PMH listed below was seen in consultation at the request of  Haynes, Nathan Noa, MD  for evaluation of right lower extremity thrombpsis  -patient  presented Reported a history of a blood clot in the left lower leg in 2019, which was treated with Eliquis since then. SH is currently on Eliquis twice a day. He denies triggering factors. Also has A Fib as well. His cardiologist recommend anticoagulation.  -He felt  pain in the right leg "from the knee below" following an  9 hour flight over to [place] for a family funeral. Described the pain as intense, to the point  disturbing his sleep.He visited an urgent care in [place] due to persistent pain, where a diagnosis of a blood clot in the right lower leg was made. SH was reassured by the [place] physician that being on Eliquis was appropriate management. 2 days later, pain has resolved.   - Patient mentioned being physically active, engaging in hiking and treadmill exercises, despite slowing down due to age, currently 82 years old. - he never got screening colonoscopy after 48.Denies weight loss, fever, chills, fatigue, night sweats.  - on testosterone replacement, previously managed by urology Nathan Haynes who is not retired. He established with [facility] urology.    MEDICAL HISTORY:  Past Medical History:  Diagnosis Date   A-fib    DVT (deep  venous thrombosis)    Gout attack    Hypertension     SURGICAL HISTORY: Past Surgical History:  Procedure Laterality Date   CARDIOVERSION N/A 07/29/2018   Procedure: CARDIOVERSION;  Surgeon: Nathan Cowman, MD;  Location: [facility] ORS;  Service: Cardiovascular;  Laterality: N/A;   DUPUYTREN CONTRACTURE RELEASE Bilateral    FRACTURE SURGERY Right    clavicle   NASAL SINUS SURGERY     SHOULDER ARTHROSCOPY Left    SHOULDER ARTHROSCOPY WITH OPEN ROTATOR CUFF REPAIR Right 06/08/2015   Procedure: SHOULDER ARTHROSCOPY WITH OPEN ROTATOR CUFF REPAIR;   Surgeon: Nathan Park, MD;  Location: [facility] ORS;  Service: Orthopedics;  Laterality: Right;    SOCIAL HISTORY: Social History   Socioeconomic History   Marital status: Married    Spouse name: Not on file   Number of children: Not on file   Years of education: Not on file   Highest education level: Not on file  Occupational History   Not on file  Tobacco Use   Smoking status: Former    Packs/day: 1.00    Years: 28.00    Additional pack years: 0.00    Total pack years: 28.00    Types: Cigarettes    Quit date: 57    Years since quitting: 30.2   Smokeless tobacco: Never  Vaping Use   Vaping Use: Never used  Substance and Sexual Activity   Alcohol use: Not Currently    Alcohol/week: 2.0 standard drinks of alcohol    Types: 2 Glasses of wine per week   Drug use: No   Sexual activity: Not on file  Other Topics Concern   Not on file  Social History Narrative   Not on file   Social Determinants of Health   Financial Resource Strain: Not on file  Food Insecurity: No Food Insecurity (01/23/2023)   Hunger Vital Sign    Worried About Running Out of Food in the Last Year: Never true    Ran Out of Food in the Last Year: Never true  Transportation Needs: No Transportation Needs (01/23/2023)   PRAPARE - Hydrologist (Medical): No    Lack of Transportation (Non-Medical): No  Physical Activity: Not on file  Stress: Not on file  Social Connections: Not on file  Intimate Partner Violence: Not At Risk (01/23/2023)   Humiliation, Afraid, Rape, and Kick questionnaire    Fear of Current or Ex-Partner: No    Emotionally Abused: No    Physically Abused: No    Sexually Abused: No    FAMILY HISTORY: Family History  Problem Relation Age of Onset   Thrombosis Mother    Liver disease Father    Diabetes Son     ALLERGIES:  has No Known Allergies.  MEDICATIONS:  Current Outpatient Medications  Medication Sig Dispense Refill   amLODipine (NORVASC) 5 MG  tablet Take 1 tablet (5 mg total) by mouth daily. 90 tablet 1   apixaban (ELIQUIS) 5 MG TABS tablet Take 1 tablet (5 mg total) by mouth 2 (two) times daily. 60 tablet 1   atorvastatin (LIPITOR) 20 MG tablet Take 20 mg by mouth daily.     dutasteride (AVODART) 0.5 MG capsule Take 0.5 mg by mouth daily.     flecainide (TAMBOCOR) 50 MG tablet Take 50 mg by mouth 2 (two) times daily.     losartan-hydrochlorothiazide (HYZAAR) 50-12.5 MG tablet Take 1 tablet by mouth daily.     metoprolol succinate (TOPROL-XL) 100 MG 24 hr  tablet Take 100 mg by mouth daily. Take with or immediately following a meal.     silodosin (RAPAFLO) 8 MG CAPS capsule Take 1 capsule by mouth daily.     Testosterone 20 % CREA Apply 1 application topically daily.     No current facility-administered medications for this visit.    Review of Systems  Constitutional:  Positive for fatigue. Negative for appetite change, chills, fever and unexpected weight change.  HENT:   Negative for hearing loss and voice change.   Eyes:  Negative for eye problems and icterus.  Respiratory:  Negative for chest tightness, cough and shortness of breath.   Cardiovascular:  Negative for chest pain and leg swelling.  Gastrointestinal:  Negative for abdominal distention and abdominal pain.  Endocrine: Negative for hot flashes.  Genitourinary:  Negative for difficulty urinating, dysuria and frequency.   Musculoskeletal:  Negative for arthralgias.  Skin:  Negative for itching and rash.  Neurological:  Negative for light-headedness and numbness.  Hematological:  Negative for adenopathy. Does not bruise/bleed easily.  Psychiatric/Behavioral:  Negative for confusion.    PHYSICAL EXAMINATION:  Vitals:   01/23/23 0920  BP: (!) 145/81  Pulse: (!) 55  Resp: 18  Temp: (!) 97.4 F (36.3 C)   Filed Weights   01/23/23 0920  Weight: 162 lb 4.8 oz (73.6 kg)    Physical Exam Constitutional:      General: He is not in acute distress. HENT:      Head: Normocephalic and atraumatic.  Eyes:     General: No scleral icterus. Cardiovascular:     Rate and Rhythm: Normal rate.  Pulmonary:     Effort: Pulmonary effort is normal. No respiratory distress.     Breath sounds: No wheezing.  Abdominal:     General: Bowel sounds are normal. There is no distension.     Palpations: Abdomen is soft.  Musculoskeletal:        General: No deformity. Normal range of motion.     Cervical back: Normal range of motion and neck supple.  Skin:    General: Skin is warm and dry.     Findings: No erythema or rash.     Comments: Varicose vein on left lower extremity  Neurological:     Mental Status: He is alert and oriented to person, place, and time. Mental status is at baseline.     Cranial Nerves: No cranial nerve deficit.  Psychiatric:        Mood and Affect: Mood normal.     LABORATORY DATA:  I have reviewed the data as listed    Latest Ref Rng & Units 01/22/2021    8:00 PM 05/25/2015    8:08 AM  CBC  WBC 4.0 - 10.5 K/uL 6.9  6.9   Hemoglobin 13.0 - 17.0 g/dL 12.6  13.2   Hematocrit 39.0 - 52.0 % 37.8  40.3   Platelets 150 - 400 K/uL 184  192       Latest Ref Rng & Units 08/28/2022    8:34 AM 01/22/2021    8:00 PM 07/20/2019   11:03 AM  CMP  Glucose 70 - 99 mg/dL  109    BUN 8 - 23 mg/dL  23    Creatinine 0.61 - 1.24 mg/dL 1.40  1.39  1.15   Sodium 135 - 145 mmol/L  136    Potassium 3.5 - 5.1 mmol/L  3.6    Chloride 98 - 111 mmol/L  101    CO2 22 -  32 mmol/L  27    Calcium 8.9 - 10.3 mg/dL  9.0    Total Protein 6.5 - 8.1 g/dL  7.2    Total Bilirubin 0.3 - 1.2 mg/dL  0.6    Alkaline Phos 38 - 126 U/L  47    AST 15 - 41 U/L  24    ALT 0 - 44 U/L  17        RADIOGRAPHIC STUDIES: I have personally reviewed the radiological images as listed and agreed with the findings in the report. No results found.

## 2023-01-27 ENCOUNTER — Ambulatory Visit
Admission: RE | Admit: 2023-01-27 | Discharge: 2023-01-27 | Disposition: A | Discharge status: Home / Self Care | Payer: Medicare HMO | Source: Ambulatory Visit | Attending: Oncology | Admitting: Oncology

## 2023-01-27 DIAGNOSIS — I824Y1 Acute embolism and thrombosis of unspecified deep veins of right proximal lower extremity: Secondary | ICD-10-CM | POA: Insufficient documentation

## 2023-01-28 ENCOUNTER — Encounter: Payer: Self-pay | Admitting: Oncology

## 2023-01-28 NOTE — Telephone Encounter (Signed)
Please advise on US result.

## 2023-02-03 LAB — PROTHROMBIN GENE MUTATION

## 2023-02-10 LAB — FACTOR 5 LEIDEN

## 2023-02-13 ENCOUNTER — Inpatient Hospital Stay (HOSPITAL_BASED_OUTPATIENT_CLINIC_OR_DEPARTMENT_OTHER): Payer: Medicare HMO | Admitting: Oncology

## 2023-02-13 ENCOUNTER — Encounter: Payer: Self-pay | Admitting: Oncology

## 2023-02-13 VITALS — BP 128/83 | HR 57 | Temp 96.9°F | Resp 18 | Wt 160.3 lb

## 2023-02-13 DIAGNOSIS — I82412 Acute embolism and thrombosis of left femoral vein: Secondary | ICD-10-CM | POA: Diagnosis not present

## 2023-02-13 DIAGNOSIS — I82401 Acute embolism and thrombosis of unspecified deep veins of right lower extremity: Secondary | ICD-10-CM | POA: Diagnosis not present

## 2023-02-13 NOTE — Assessment & Plan Note (Addendum)
Thrombosis of RLE, unclear superficial vs deep. Records form [place] not available.  Likely triggered by  post long-haul flight, possible delay of his eliquis dose during his trip, testosterone use.  Repeat ultrasound of the right lower leg negative, indicating that Eliquis is still effective.  Negative  factor 5 leiden mutation, prothrombin gene mutation.  Continue Eliquis  BID.

## 2023-02-13 NOTE — Progress Notes (Signed)
Hematology/Oncology Consult note [phone] [phone]      CHIEF COMPLAINTS/REASON FOR VISIT:   right LE thrombosis   ASSESSMENT & PLAN:   Deep vein thrombosis (DVT) Thrombosis of RLE, unclear superficial vs deep. Records form [place] not available.  Likely triggered by  post long-haul flight, possible delay of his eliquis dose during his trip, testosterone use.  Repeat ultrasound of the right lower leg negative  Negative  factor 5 leiden mutation, prothrombin gene mutation.  Continue Eliquis  BID.   Patient is discharged from my clinic. I recommend patient to continue follow up with primary care physician. Patient may re-establish care in the future if clinically indicated.   All questions were answered. The patient knows to call the clinic with any problems, questions or concerns.  Nathan Patience, MD, PhD [facility] Hematology Oncology 02/13/2023   HISTORY OF PRESENTING ILLNESS:   Nathan Haynes is a  82 y.o.  male with PMH listed below was seen in consultation at the request of  Feldpausch, Madaline Guthrie, MD  for evaluation of right lower extremity thrombpsis  -patient  presented Reported a history of a blood clot in the left lower leg in 2019, which was treated with Eliquis since then. SH is currently on Eliquis twice a day. He denies triggering factors. Also has A Fib as well. His cardiologist recommend anticoagulation.  -He felt  pain in the right leg "from the knee below" following an  9 hour flight over to [place] for a family funeral. Described the pain as intense, to the point  disturbing his sleep.He visited an urgent care in [place] due to persistent pain, where a diagnosis of a blood clot in the right lower leg was made. SH was reassured by the [place] physician that being on Eliquis was appropriate management. 2 days later, pain has resolved.   - Patient mentioned being physically active, engaging in hiking and treadmill exercises, despite slowing down  due to age, currently 82 years old. - he never got screening colonoscopy after 48.Denies weight loss, fever, chills, fatigue, night sweats.  - on testosterone replacement, previously managed by urology Dr. Artis Flock who is not retired. He established with [facility] urology.    INTERVAL HISTORY CHENG DEC is a 82 y.o. male who has above history reviewed by me today presents for follow up visit for history of thrombosis.  He presents to discuss work up results and anticoagulation recommendation.  No new complaints.    MEDICAL HISTORY:  Past Medical History:  Diagnosis Date   A-fib    DVT (deep venous thrombosis)    Gout attack    Hypertension     SURGICAL HISTORY: Past Surgical History:  Procedure Laterality Date   CARDIOVERSION N/A 07/29/2018   Procedure: CARDIOVERSION;  Surgeon: Marcina Millard, MD;  Location: [facility] ORS;  Service: Cardiovascular;  Laterality: N/A;   DUPUYTREN CONTRACTURE RELEASE Bilateral    FRACTURE SURGERY Right    clavicle   NASAL SINUS SURGERY     SHOULDER ARTHROSCOPY Left    SHOULDER ARTHROSCOPY WITH OPEN ROTATOR CUFF REPAIR Right 06/08/2015   Procedure: SHOULDER ARTHROSCOPY WITH OPEN ROTATOR CUFF REPAIR;  Surgeon: Juanell Fairly, MD;  Location: [facility] ORS;  Service: Orthopedics;  Laterality: Right;    SOCIAL HISTORY: Social History   Socioeconomic History   Marital status: Married    Spouse name: Not on file   Number of children: Not on file   Years of education: Not on file   Highest education level:  Not on file  Occupational History   Not on file  Tobacco Use   Smoking status: Former    Packs/day: 1.00    Years: 28.00    Additional pack years: 0.00    Total pack years: 28.00    Types: Cigarettes    Quit date: 47    Years since quitting: 30.3   Smokeless tobacco: Never  Vaping Use   Vaping Use: Never used  Substance and Sexual Activity   Alcohol use: Not Currently    Alcohol/week: 2.0 standard drinks of alcohol    Types: 2 Glasses of  wine per week   Drug use: No   Sexual activity: Not on file  Other Topics Concern   Not on file  Social History Narrative   Not on file   Social Determinants of Health   Financial Resource Strain: Not on file  Food Insecurity: No Food Insecurity (01/23/2023)   Hunger Vital Sign    Worried About Running Out of Food in the Last Year: Never true    Ran Out of Food in the Last Year: Never true  Transportation Needs: No Transportation Needs (01/23/2023)   PRAPARE - Administrator, Civil Service (Medical): No    Lack of Transportation (Non-Medical): No  Physical Activity: Not on file  Stress: Not on file  Social Connections: Not on file  Intimate Partner Violence: Not At Risk (01/23/2023)   Humiliation, Afraid, Rape, and Kick questionnaire    Fear of Current or Ex-Partner: No    Emotionally Abused: No    Physically Abused: No    Sexually Abused: No    FAMILY HISTORY: Family History  Problem Relation Age of Onset   Thrombosis Mother    Liver disease Father    Diabetes Son     ALLERGIES:  has No Known Allergies.  MEDICATIONS:  Current Outpatient Medications  Medication Sig Dispense Refill   ALBUTEROL SULFATE HFA IN Inhale 1 puff into the lungs every 4 (four) hours as needed.     amLODipine (NORVASC) 5 MG tablet Take 1 tablet (5 mg total) by mouth daily. 90 tablet 1   apixaban (ELIQUIS) 5 MG TABS tablet Take 1 tablet (5 mg total) by mouth 2 (two) times daily. 60 tablet 1   atorvastatin (LIPITOR) 20 MG tablet Take 20 mg by mouth daily.     dutasteride (AVODART) 0.5 MG capsule Take 0.5 mg by mouth daily.     flecainide (TAMBOCOR) 50 MG tablet Take 50 mg by mouth 2 (two) times daily.     losartan-hydrochlorothiazide (HYZAAR) 50-12.5 MG tablet Take 1 tablet by mouth daily.     metoprolol succinate (TOPROL-XL) 100 MG 24 hr tablet Take 100 mg by mouth daily. Take with or immediately following a meal.     silodosin (RAPAFLO) 8 MG CAPS capsule Take 1 capsule by mouth daily.      Testosterone 20 % CREA Apply 1 application topically daily.     No current facility-administered medications for this visit.    Review of Systems  Constitutional:  Positive for fatigue. Negative for appetite change, chills, fever and unexpected weight change.  HENT:   Negative for hearing loss and voice change.   Eyes:  Negative for eye problems and icterus.  Respiratory:  Negative for chest tightness, cough and shortness of breath.   Cardiovascular:  Negative for chest pain and leg swelling.  Gastrointestinal:  Negative for abdominal distention and abdominal pain.  Endocrine: Negative for hot flashes.  Genitourinary:  Negative for difficulty urinating, dysuria and frequency.   Musculoskeletal:  Negative for arthralgias.  Skin:  Negative for itching and rash.  Neurological:  Negative for light-headedness and numbness.  Hematological:  Negative for adenopathy. Does not bruise/bleed easily.  Psychiatric/Behavioral:  Negative for confusion.    PHYSICAL EXAMINATION:  Vitals:   02/13/23 1046  BP: 128/83  Pulse: (!) 57  Resp: 18  Temp: (!) 96.9 F (36.1 C)   Filed Weights   02/13/23 1046  Weight: 160 lb 4.8 oz (72.7 kg)    Physical Exam Constitutional:      General: He is not in acute distress. HENT:     Head: Normocephalic and atraumatic.  Eyes:     General: No scleral icterus. Cardiovascular:     Rate and Rhythm: Normal rate.  Pulmonary:     Effort: Pulmonary effort is normal. No respiratory distress.  Abdominal:     General: There is no distension.  Musculoskeletal:        General: No deformity. Normal range of motion.     Cervical back: Normal range of motion.  Skin:    Findings: No rash.     Comments: Varicose vein on left lower extremity  Neurological:     Mental Status: He is alert and oriented to person, place, and time. Mental status is at baseline.  Psychiatric:        Mood and Affect: Mood normal.     LABORATORY DATA:  I have reviewed the data as  listed    Latest Ref Rng & Units 01/22/2021    8:00 PM 05/25/2015    8:08 AM  CBC  WBC 4.0 - 10.5 K/uL 6.9  6.9   Hemoglobin 13.0 - 17.0 g/dL 96.0  45.4   Hematocrit 39.0 - 52.0 % 37.8  40.3   Platelets 150 - 400 K/uL 184  192       Latest Ref Rng & Units 08/28/2022    8:34 AM 01/22/2021    8:00 PM 07/20/2019   11:03 AM  CMP  Glucose 70 - 99 mg/dL  098    BUN 8 - 23 mg/dL  23    Creatinine 1.19 - 1.24 mg/dL 1.47  8.29  5.62   Sodium 135 - 145 mmol/L  136    Potassium 3.5 - 5.1 mmol/L  3.6    Chloride 98 - 111 mmol/L  101    CO2 22 - 32 mmol/L  27    Calcium 8.9 - 10.3 mg/dL  9.0    Total Protein 6.5 - 8.1 g/dL  7.2    Total Bilirubin 0.3 - 1.2 mg/dL  0.6    Alkaline Phos 38 - 126 U/L  47    AST 15 - 41 U/L  24    ALT 0 - 44 U/L  17        RADIOGRAPHIC STUDIES: I have personally reviewed the radiological images as listed and agreed with the findings in the report. US Venous Img Lower Unilateral Right  Addendum Date: 01/27/2023   ADDENDUM REPORT: 01/27/2023 11:51 ADDENDUM: Initially there was an error regarding the worksheet that led to me describing the exam as left lower extremity venous Doppler ultrasound. In fact, the patient's symptoms are on the right and the study is of the right lower extremity. Left common femoral vein was evaluated as. Protocol, but the examination is of the right leg in total. Electronically Signed   By: Paulina Fusi M.D.   On: 01/27/2023 11:51  Result Date: 01/27/2023 CLINICAL DATA:  Acute DVT EXAM: Left LOWER EXTREMITY VENOUS DOPPLER ULTRASOUND TECHNIQUE: Gray-scale sonography with compression, as well as color and duplex ultrasound, were performed to evaluate the deep venous system(s) from the level of the common femoral vein through the popliteal and proximal calf veins. COMPARISON:  None Available. FINDINGS: VENOUS Normal compressibility of the common femoral, superficial femoral, and popliteal veins, as well as the visualized calf veins. Visualized  portions of profunda femoral vein and great saphenous vein unremarkable. No filling defects to suggest DVT on grayscale or color Doppler imaging. Doppler waveforms show normal direction of venous flow, normal respiratory plasticity and response to augmentation. Limited views of the contralateral common femoral vein are unremarkable. OTHER None. Limitations: none IMPRESSION: Negative. The technologist notes that the distal femoral vein on the left is not compressible but does show normal Doppler and color flow characteristics. Sometimes, the vein may not be compressible in the femoral popliteal junction region. Electronically Signed: By: Paulina Fusi M.D. On: 01/27/2023 11:30

## 2023-08-19 ENCOUNTER — Ambulatory Visit
Admission: RE | Admit: 2023-08-19 | Discharge: 2023-08-19 | Disposition: A | Discharge status: Home / Self Care | Payer: Medicare HMO | Attending: Cardiology | Admitting: Cardiology

## 2023-08-19 ENCOUNTER — Encounter
Admission: RE | Disposition: A | Discharge status: Home / Self Care | Payer: Self-pay | Source: Home / Self Care | Attending: Cardiology

## 2023-08-19 ENCOUNTER — Ambulatory Visit: Payer: Medicare HMO | Admitting: General Practice

## 2023-08-19 ENCOUNTER — Encounter: Payer: Self-pay | Admitting: Cardiology

## 2023-08-19 DIAGNOSIS — I480 Paroxysmal atrial fibrillation: Secondary | ICD-10-CM | POA: Diagnosis not present

## 2023-08-19 DIAGNOSIS — Z79899 Other long term (current) drug therapy: Secondary | ICD-10-CM | POA: Insufficient documentation

## 2023-08-19 DIAGNOSIS — N400 Enlarged prostate without lower urinary tract symptoms: Secondary | ICD-10-CM | POA: Diagnosis not present

## 2023-08-19 DIAGNOSIS — I4819 Other persistent atrial fibrillation: Secondary | ICD-10-CM

## 2023-08-19 DIAGNOSIS — Z86718 Personal history of other venous thrombosis and embolism: Secondary | ICD-10-CM | POA: Insufficient documentation

## 2023-08-19 DIAGNOSIS — I10 Essential (primary) hypertension: Secondary | ICD-10-CM | POA: Diagnosis not present

## 2023-08-19 DIAGNOSIS — I444 Left anterior fascicular block: Secondary | ICD-10-CM | POA: Diagnosis not present

## 2023-08-19 DIAGNOSIS — Z87891 Personal history of nicotine dependence: Secondary | ICD-10-CM | POA: Diagnosis not present

## 2023-08-19 DIAGNOSIS — E785 Hyperlipidemia, unspecified: Secondary | ICD-10-CM | POA: Insufficient documentation

## 2023-08-19 DIAGNOSIS — Z7901 Long term (current) use of anticoagulants: Secondary | ICD-10-CM | POA: Insufficient documentation

## 2023-08-19 DIAGNOSIS — I4891 Unspecified atrial fibrillation: Secondary | ICD-10-CM | POA: Diagnosis present

## 2023-08-19 HISTORY — DX: Cardiac arrhythmia, unspecified: I49.9

## 2023-08-19 HISTORY — PX: CARDIOVERSION: SHX1299

## 2023-08-19 HISTORY — DX: Benign prostatic hyperplasia without lower urinary tract symptoms: N40.0

## 2023-08-19 SURGERY — CARDIOVERSION
Anesthesia: General

## 2023-08-19 MED ORDER — PROPOFOL 10 MG/ML IV BOLUS
INTRAVENOUS | Status: AC
  Filled 2023-08-19: qty 20

## 2023-08-19 MED ORDER — PROPOFOL 10 MG/ML IV BOLUS
INTRAVENOUS | Status: DC | PRN
  Administered 2023-08-19: 70 mg via INTRAVENOUS

## 2023-08-19 NOTE — Anesthesia Procedure Notes (Signed)
Date/Time: 08/19/2023 7:30 AM  Performed by: Stormy Fabian, CRNAPre-anesthesia Checklist: Patient identified, Emergency Drugs available, Suction available and Patient being monitored Patient Re-evaluated:Patient Re-evaluated prior to induction Oxygen Delivery Method: Nasal cannula Induction Type: IV induction Dental Injury: Teeth and Oropharynx as per pre-operative assessment  Comments: Nasal cannula with etCO2 monitoring

## 2023-08-19 NOTE — Transfer of Care (Signed)
Immediate Anesthesia Transfer of Care Note  Patient: Nathan Haynes  Procedure(s) Performed: Procedure(s): CARDIOVERSION (N/A)  Patient Location: PACU and Short Stay  Anesthesia Type:General  Level of Consciousness: awake, alert  and oriented  Airway & Oxygen Therapy: Patient Spontanous Breathing and Patient connected to nasal cannula oxygen  Post-op Assessment: Report given to RN and Post -op Vital signs reviewed and stable  Post vital signs: Reviewed and stable  Last Vitals:  Vitals:   08/19/23 0656 08/19/23 0737  BP: (!) 169/71 139/72  Pulse: (!) 57   Resp: 13   Temp: (!) 36.3 C   SpO2: 99% 100%    Complications: No apparent anesthesia complications

## 2023-08-19 NOTE — Anesthesia Preprocedure Evaluation (Signed)
Anesthesia Evaluation  Patient identified by MRN, date of birth, ID band Patient awake    Reviewed: Allergy & Precautions, H&P , NPO status , Patient's Chart, lab work & pertinent test results, reviewed documented beta blocker date and time   Airway Mallampati: II  TM Distance: >3 FB Neck ROM: full    Dental  (+) Teeth Intact, Poor Dentition, Chipped   Pulmonary neg pulmonary ROS, former smoker   Pulmonary exam normal        Cardiovascular Exercise Tolerance: Good hypertension, On Medications negative cardio ROS Normal cardiovascular exam+ dysrhythmias Atrial Fibrillation  Rhythm:regular Rate:Normal     Neuro/Psych negative neurological ROS  negative psych ROS   GI/Hepatic negative GI ROS, Neg liver ROS,,,  Endo/Other  negative endocrine ROS    Renal/GU negative Renal ROS  negative genitourinary   Musculoskeletal   Abdominal   Peds  Hematology negative hematology ROS (+)   Anesthesia Other Findings Past Medical History: No date: A-fib (HCC) No date: DVT (deep venous thrombosis) (HCC) No date: Dysrhythmia No date: Enlarged prostate No date: Gout attack No date: Hypertension  Past Surgical History: 07/29/2018: CARDIOVERSION; N/A     Comment:  Procedure: CARDIOVERSION;  Surgeon: Marcina Millard, MD;  Location: [facility] ORS;  Service:               Cardiovascular;  Laterality: N/A; No date: DUPUYTREN CONTRACTURE RELEASE; Bilateral No date: FRACTURE SURGERY; Right     Comment:  clavicle No date: NASAL SINUS SURGERY No date: SHOULDER ARTHROSCOPY; Left 06/08/2015: SHOULDER ARTHROSCOPY WITH OPEN ROTATOR CUFF REPAIR; Right     Comment:  Procedure: SHOULDER ARTHROSCOPY WITH OPEN ROTATOR CUFF               REPAIR;  Surgeon: Juanell Fairly, MD;  Location: [facility]               ORS;  Service: Orthopedics;  Laterality: Right;  BMI    Body Mass Index: 23.33 kg/m      Reproductive/Obstetrics negative  OB ROS                             Anesthesia Physical Anesthesia Plan  ASA: 3  Anesthesia Plan: General   Post-op Pain Management: Minimal or no pain anticipated   Induction: Intravenous  PONV Risk Score and Plan: 2 and Propofol infusion and TIVA  Airway Management Planned: Nasal Cannula  Additional Equipment: None  Intra-op Plan:   Post-operative Plan:   Informed Consent: I have reviewed the patients History and Physical, chart, labs and discussed the procedure including the risks, benefits and alternatives for the proposed anesthesia with the patient or authorized representative who has indicated his/her understanding and acceptance.     Dental advisory given  Plan Discussed with: CRNA and Surgeon  Anesthesia Plan Comments: (Discussed risks of anesthesia with patient, including possibility of difficulty with spontaneous ventilation under anesthesia necessitating airway intervention, PONV, and rare risks such as cardiac or respiratory or neurological events, and allergic reactions. Discussed the role of CRNA in patient's perioperative care. Patient understands.)       Anesthesia Quick Evaluation

## 2023-08-19 NOTE — Op Note (Signed)
[facility] Cardiology   08/19/2023                     7:39 AM  PATIENT:  Lonie Peak    PRE-OPERATIVE DIAGNOSIS:  Cardioversion   Afib  POST-OPERATIVE DIAGNOSIS:  Same  PROCEDURE:  CARDIOVERSION  SURGEON:  Marcina Millard, MD    ANESTHESIA:     PREOPERATIVE INDICATIONS:  SIDY CURTNER is a  82 y.o. male with a diagnosis of Cardioversion   Afib who failed conservative measures and elected for surgical management.    The risks benefits and alternatives were discussed with the patient preoperatively including but not limited to the risks of infection, bleeding, cardiopulmonary complications, the need for revision surgery, among others, and the patient was willing to proceed.   OPERATIVE PROCEDURE: The patient presented to special procedures holding area in a fasting state.  ECG revealed atrial fibrillation at 60 bpm.  The patient received 70 mg of propofol.  Synchronized direct-current cardioversion was performed with 75 and 120 J with successful conversion to sinus bradycardia 55 bpm.  There were no periprocedural complications.

## 2023-08-20 NOTE — Anesthesia Postprocedure Evaluation (Signed)
Anesthesia Post Note  Patient: Nathan Haynes  Procedure(s) Performed: CARDIOVERSION  Patient location during evaluation: Specials Recovery Anesthesia Type: General Level of consciousness: awake and alert Pain management: pain level controlled Vital Signs Assessment: post-procedure vital signs reviewed and stable Respiratory status: spontaneous breathing, nonlabored ventilation, respiratory function stable and patient connected to nasal cannula oxygen Cardiovascular status: blood pressure returned to baseline and stable Postop Assessment: no apparent nausea or vomiting Anesthetic complications: no   No notable events documented.   Last Vitals:  Vitals:   08/19/23 0800 08/19/23 0815  BP: 123/68 116/68  Pulse: (!) 52 (!) 52  Resp: 17 (!) 7  Temp:    SpO2: 98% 97%    Last Pain:  Vitals:   08/19/23 0815  TempSrc:   PainSc: 0-No pain                 Stephanie Coup

## 2023-09-01 NOTE — Anesthesia Preprocedure Evaluation (Signed)
Anesthesia Evaluation  Patient identified by MRN, date of birth, ID band Patient awake    Reviewed: Allergy & Precautions, H&P , NPO status , Patient's Chart, lab work & pertinent test results, reviewed documented beta blocker date and time   Airway Mallampati: III  TM Distance: >3 FB Neck ROM: full    Dental  (+) Teeth Intact, Poor Dentition, Chipped   Pulmonary neg pulmonary ROS, former smoker   Pulmonary exam normal        Cardiovascular Exercise Tolerance: Good hypertension, On Medications negative cardio ROS + dysrhythmias Atrial Fibrillation  Rhythm:Irregular Rate:Bradycardia     Neuro/Psych negative neurological ROS  negative psych ROS   GI/Hepatic negative GI ROS, Neg liver ROS,,,  Endo/Other  negative endocrine ROS    Renal/GU negative Renal ROS  negative genitourinary   Musculoskeletal   Abdominal Normal abdominal exam  (+)   Peds  Hematology negative hematology ROS (+)   Anesthesia Other Findings Past Medical History: No date: A-fib (HCC) No date: DVT (deep venous thrombosis) (HCC) No date: Dysrhythmia No date: Enlarged prostate No date: Gout attack No date: Hypertension  Past Surgical History: 07/29/2018: CARDIOVERSION; N/A     Comment:  Procedure: CARDIOVERSION;  Surgeon: Marcina Millard, MD;  Location: [facility] ORS;  Service:               Cardiovascular;  Laterality: N/A; No date: DUPUYTREN CONTRACTURE RELEASE; Bilateral No date: FRACTURE SURGERY; Right     Comment:  clavicle No date: NASAL SINUS SURGERY No date: SHOULDER ARTHROSCOPY; Left 06/08/2015: SHOULDER ARTHROSCOPY WITH OPEN ROTATOR CUFF REPAIR; Right     Comment:  Procedure: SHOULDER ARTHROSCOPY WITH OPEN ROTATOR CUFF               REPAIR;  Surgeon: Juanell Fairly, MD;  Location: [facility]               ORS;  Service: Orthopedics;  Laterality: Right;  BMI    Body Mass Index: 23.33 kg/m       Reproductive/Obstetrics negative OB ROS                              Anesthesia Physical Anesthesia Plan  ASA: 3  Anesthesia Plan: General   Post-op Pain Management: Minimal or no pain anticipated   Induction: Intravenous  PONV Risk Score and Plan: 2 and Propofol infusion and TIVA  Airway Management Planned: Nasal Cannula  Additional Equipment: None  Intra-op Plan:   Post-operative Plan:   Informed Consent: I have reviewed the patients History and Physical, chart, labs and discussed the procedure including the risks, benefits and alternatives for the proposed anesthesia with the patient or authorized representative who has indicated his/her understanding and acceptance.     Dental advisory given  Plan Discussed with: CRNA and Surgeon  Anesthesia Plan Comments:         Anesthesia Quick Evaluation

## 2023-09-02 ENCOUNTER — Ambulatory Visit
Admission: RE | Admit: 2023-09-02 | Discharge: 2023-09-02 | Disposition: A | Discharge status: Home / Self Care | Payer: Medicare HMO | Attending: Cardiology | Admitting: Cardiology

## 2023-09-02 ENCOUNTER — Ambulatory Visit: Payer: Medicare HMO | Admitting: Anesthesiology

## 2023-09-02 ENCOUNTER — Encounter
Admission: RE | Disposition: A | Discharge status: Home / Self Care | Payer: Self-pay | Source: Home / Self Care | Attending: Cardiology

## 2023-09-02 DIAGNOSIS — Z87891 Personal history of nicotine dependence: Secondary | ICD-10-CM | POA: Diagnosis not present

## 2023-09-02 DIAGNOSIS — Z86718 Personal history of other venous thrombosis and embolism: Secondary | ICD-10-CM | POA: Insufficient documentation

## 2023-09-02 DIAGNOSIS — I480 Paroxysmal atrial fibrillation: Secondary | ICD-10-CM | POA: Diagnosis present

## 2023-09-02 DIAGNOSIS — E785 Hyperlipidemia, unspecified: Secondary | ICD-10-CM | POA: Insufficient documentation

## 2023-09-02 DIAGNOSIS — R001 Bradycardia, unspecified: Secondary | ICD-10-CM | POA: Diagnosis not present

## 2023-09-02 DIAGNOSIS — I10 Essential (primary) hypertension: Secondary | ICD-10-CM | POA: Insufficient documentation

## 2023-09-02 DIAGNOSIS — Z79899 Other long term (current) drug therapy: Secondary | ICD-10-CM | POA: Diagnosis not present

## 2023-09-02 DIAGNOSIS — N400 Enlarged prostate without lower urinary tract symptoms: Secondary | ICD-10-CM | POA: Diagnosis not present

## 2023-09-02 DIAGNOSIS — M109 Gout, unspecified: Secondary | ICD-10-CM | POA: Diagnosis not present

## 2023-09-02 DIAGNOSIS — I4819 Other persistent atrial fibrillation: Secondary | ICD-10-CM

## 2023-09-02 DIAGNOSIS — I4891 Unspecified atrial fibrillation: Secondary | ICD-10-CM | POA: Diagnosis not present

## 2023-09-02 HISTORY — PX: CARDIOVERSION: SHX1299

## 2023-09-02 SURGERY — CARDIOVERSION
Anesthesia: General

## 2023-09-02 MED ORDER — PROPOFOL 10 MG/ML IV BOLUS
INTRAVENOUS | Status: DC | PRN
  Administered 2023-09-02: 50 mg via INTRAVENOUS

## 2023-09-02 MED ORDER — SODIUM CHLORIDE 0.9 % IV SOLN: INTRAVENOUS | Status: DC

## 2023-09-02 NOTE — Transfer of Care (Signed)
Immediate Anesthesia Transfer of Care Note  Patient: Nathan Haynes  Procedure(s) Performed: CARDIOVERSION  Patient Location: PACU  Anesthesia Type:General  Level of Consciousness: awake, alert , and oriented  Airway & Oxygen Therapy: Patient Spontanous Breathing  Post-op Assessment: Report given to RN and Post -op Vital signs reviewed and stable  Post vital signs: Reviewed and stable  Last Vitals:  Vitals Value Taken Time  BP 128/67 09/02/23 0728  Temp    Pulse 53 09/02/23 0729  Resp 20 09/02/23 0729  SpO2 95 % 09/02/23 0729    Last Pain:  Vitals:   09/02/23 0651  PainSc: 0-No pain         Complications: No notable events documented.

## 2023-09-02 NOTE — Op Note (Signed)
[facility] Cardiology   09/02/2023                     7:30 AM  PATIENT:  Nathan Haynes    PRE-OPERATIVE DIAGNOSIS:  Cardioversion  Afib  POST-OPERATIVE DIAGNOSIS:  Same  PROCEDURE:  CARDIOVERSION  SURGEON:  Marcina Millard, MD    ANESTHESIA:     PREOPERATIVE INDICATIONS:  [name] is a  82 y.o. male with a diagnosis of Cardioversion  Afib who failed conservative measures and elected for surgical management.    The risks benefits and alternatives were discussed with the patient preoperatively including but not limited to the risks of infection, bleeding, cardiopulmonary complications, the need for revision surgery, among others, and the patient was willing to proceed.   OPERATIVE PROCEDURE: The patient presented to special procedures holding area in a fasting state.  ECG revealed atrial fibrillation at a rate of 60 bpm.  Patient received 50 mg of propofol.  Synchronized direct-current cardioversion was performed with 75 and 120 J with successful conversion to sinus bradycardia at a rate of 54 bpm.  There were no periprocedural complications.

## 2023-09-03 ENCOUNTER — Encounter: Payer: Self-pay | Admitting: Cardiology

## 2023-09-03 NOTE — Anesthesia Postprocedure Evaluation (Signed)
Anesthesia Post Note  Patient: Nathan Haynes  Procedure(s) Performed: CARDIOVERSION  Patient location during evaluation: Specials Recovery Anesthesia Type: General Level of consciousness: awake and alert Pain management: pain level controlled Vital Signs Assessment: post-procedure vital signs reviewed and stable Respiratory status: spontaneous breathing, nonlabored ventilation and respiratory function stable Cardiovascular status: blood pressure returned to baseline and stable Postop Assessment: no apparent nausea or vomiting Anesthetic complications: no   No notable events documented.   Last Vitals:  Vitals:   09/02/23 0745 09/02/23 0800  BP: 100/60 (!) 107/57  Pulse: (!) 52 (!) 52  Resp: 16 12  Temp:    SpO2: 95% 97%    Last Pain:  Vitals:   09/02/23 0800  PainSc: 0-No pain                 Foye Deer

## 2024-11-10 ENCOUNTER — Ambulatory Visit: Admission: EM | Admit: 2024-11-10 | Discharge: 2024-11-10 | Attending: Family Medicine | Admitting: Family Medicine

## 2024-11-10 DIAGNOSIS — R58 Hemorrhage, not elsewhere classified: Secondary | ICD-10-CM | POA: Diagnosis not present

## 2024-11-10 DIAGNOSIS — Z7901 Long term (current) use of anticoagulants: Secondary | ICD-10-CM | POA: Diagnosis not present

## 2024-11-10 DIAGNOSIS — T148XXA Other injury of unspecified body region, initial encounter: Secondary | ICD-10-CM

## 2024-11-10 NOTE — ED Triage Notes (Signed)
 Pt presents to UC for wound bleeding on L forearm. States started around 30 mins ago. Had skin surgery on it yesterday. Currently on eliquis  5 mg BID. Denies HA,dizziness or CP.

## 2024-11-10 NOTE — Discharge Instructions (Signed)
 You have an arterial bleed in your forearm while on Eliquis  after your skin cancer removal procedure. A pressure dressing was applied. You have been advised to follow up immediately in the emergency department for concerning signs or symptoms as discussed during your visit. If you declined EMS transport, please have a family member take you directly to the ED at this time. Do not delay.   Based on concerns about condition, if you do not follow up in the ED, you may risk poor outcomes including worsening of condition, delayed treatment and potentially life threatening issues. If you have declined to go to the ED at this time, you should call your PCP immediately to set up a follow up appointment.

## 2024-11-10 NOTE — ED Notes (Signed)
 Patient is being discharged from the Urgent Care and sent to the Emergency Department via POV . Per Dr.Brimage, patient is in need of higher level of care due to wound bleeding. Patient is aware and verbalizes understanding of plan of care.  Vitals:   11/10/24 1342  BP: (!) 165/76  Pulse: 74  Resp: 18  SpO2: 96%

## 2024-11-10 NOTE — ED Provider Notes (Signed)
 " [facility]    CSN: [id number] Arrival date & time: 11/10/24  1338      History   Chief Complaint Chief Complaint  Patient presents with   Wound Check    HPI FATIH STALVEY is a 84 y.o. male.   HPI  Asked by for an office staff to see patient at the counter as he is bleeding from his bandage.  History provided by patient and his wife. Basheer presents for bleeding from his forearm that started 30 minutes prior to arrival. He had another excision of a skin cancer on his forearm yesterday.  They wanted to close it with sutures but they didn't. He has a circular whole in his forearm.  He denies hitting his forearm, falls or trauma.  There has been no chest pain, shortness of breath, headache, and dizziness.  He takes Eliquis  daily for atrial fibrillation.    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 01/23/2023   Deep vein thrombosis (DVT) (HCC) 08/28/2017   Hyperlipidemia 08/28/2017   Hypertension 07/25/2017   Swelling of limb 07/25/2017    Past Surgical History:  Procedure Laterality Date   CARDIOVERSION N/A 07/29/2018   Procedure: CARDIOVERSION;  Surgeon: Ammon Blunt, MD;  Location: [facility] ORS;  Service: Cardiovascular;  Laterality: N/A;   CARDIOVERSION N/A 08/19/2023   Procedure: CARDIOVERSION;  Surgeon: Ammon Blunt, MD;  Location: [facility] OR[facility];  Service: Cardiovascular;  Laterality: N/A;   CARDIOVERSION N/A 09/02/2023   Procedure: CARDIOVERSION;  Surgeon: Ammon Blunt, MD;  Location: [facility] OR[facility];  Service: Cardiovascular;  Laterality: N/A;   DUPUYTREN CONTRACTURE RELEASE Bilateral    FRACTURE SURGERY Right    clavicle   NASAL SINUS SURGERY     SHOULDER ARTHROSCOPY Left    SHOULDER ARTHROSCOPY WITH OPEN  ROTATOR CUFF REPAIR Right 06/08/2015   Procedure: SHOULDER ARTHROSCOPY WITH OPEN ROTATOR CUFF REPAIR;  Surgeon: Franky Cranker, MD;  Location: [facility] ORS;  Service: Orthopedics;  Laterality: Right;       Home Medications    Prior to Admission medications  Medication Sig Start Date End Date Taking? Authorizing Provider  dofetilide (TIKOSYN) 250 MCG capsule Take 250 mcg by mouth every 12 (twelve) hours. 11/04/24  Yes [provider]  apixaban  (ELIQUIS ) 5 MG TABS tablet Take 1 tablet (5 mg total) by mouth 2 (two) times daily. 07/05/18   Cook, Jayce G, DO  atorvastatin (LIPITOR) 20 MG tablet Take 20 mg by mouth at bedtime.    [provider]  colchicine 0.6 MG tablet Take 0.6 mg by mouth as needed (Gout). 05/14/23   [provider]  dutasteride (AVODART) 0.5 MG capsule Take 0.5 mg by mouth daily. 01/05/21   [provider]  flecainide (TAMBOCOR) 50 MG tablet Take 100 mg by mouth every 12 (twelve) hours.    [provider]  fluticasone (FLONASE) 50 MCG/ACT nasal spray Place 2 sprays into both nostrils daily as needed for rhinitis.    [provider]  losartan (COZAAR) 50 MG tablet Take 50 mg by mouth every morning. 05/15/23   [provider]  metoprolol succinate (TOPROL-XL) 100 MG 24 hr tablet Take 100 mg by mouth daily. Take with or immediately following a meal.  [provider]  silodosin (RAPAFLO) 8 MG CAPS capsule Take 8 mg by mouth at bedtime. 01/05/21   [provider]  Testosterone 20 % CREA Apply 1 application  topically daily as needed (upper arm). 200 mg    [provider]    Family History Family History  Problem Relation Age of Onset   Thrombosis Mother    Liver disease Father    Diabetes Son     Social History Social History[1]   Allergies   Patient has no known allergies.   Review of Systems Review of Systems :negative unless otherwise stated in HPI.      Physical Exam Triage  Vital Signs ED Triage Vitals [11/10/24 1342]  Encounter Vitals Group     BP (!) 165/76     Girls Systolic BP Percentile      Girls Diastolic BP Percentile      Boys Systolic BP Percentile      Boys Diastolic BP Percentile      Pulse Rate 74     Resp 18     Temp      Temp src      SpO2 96 %     Weight      Height      Head Circumference      Peak Flow      Pain Score      Pain Loc      Pain Education      Exclude from Growth Chart    No data found.  Updated Vital Signs BP (!) 165/76 (BP Location: Left Arm)   Pulse 74   Resp 18   SpO2 96%   Visual Acuity Right Eye Distance:   Left Eye Distance:   Bilateral Distance:    Right Eye Near:   Left Eye Near:    Bilateral Near:     Physical Exam  GEN: alert,  anxious male CV: regular rate  RESP: no increased work of breathing MSK: good ROM of elbow and wrist on the left, strong radial pulse  NEURO: alert, moves all extremities appropriately SKIN: warm and dry; brisk bleeding from left forearm with pulsating bleeding vessel from the 2 cm x 3 cm circular lesion   UC Treatments / Results  Labs (all labs ordered are listed, but only abnormal results are displayed) Labs Reviewed - No data to display  EKG   Radiology No results found.  Procedures Procedures (including critical care time)  Medications Ordered in UC Medications - No data to display  Initial Impression / Assessment and Plan / UC Course  I have reviewed the triage vital signs and the nursing notes.  Pertinent labs & imaging results that were available during my care of the patient were reviewed by me and considered in my medical decision making (see chart for details).     Patient is a 84 y.o. malewho presents for wound evaluation with acute hemorrhage today after getting skin cancer removed at the dermatology office yesterday.  Overall, patient is well-appearing and well-hydrated.  Vital signs stable.  Kavontae is afebrile.  Exam concerning for  vascular bleed as there is a bleeding pulsating vessel visible with manipulation of the wound.  Patient has a circular wound that was not closed with sutures.  He is also on blood thinners.  A pressure dressing was applied.  Discussed evaluation in the ED as he is on Eliquis  with a suspected arterial bleed.  Wife will drive him to the ED at [facility]  [place].   Reviewed expectations regarding course of current medical issues.  All questions asked were answered.  Outlined signs and symptoms indicating need for more acute intervention. Patient verbalized understanding. After Visit Summary given.   Final Clinical Impressions(s) / UC Diagnoses   Final diagnoses:  Hemorrhage  Bleeding from wound  Chronic anticoagulation     Discharge Instructions      You have an arterial bleed in your forearm while on Eliquis  after your skin cancer removal procedure. A pressure dressing was applied. You have been advised to follow up immediately in the emergency department for concerning signs or symptoms as discussed during your visit. If you declined EMS transport, please have a family member take you directly to the ED at this time. Do not delay.   Based on concerns about condition, if you do not follow up in the ED, you may risk poor outcomes including worsening of condition, delayed treatment and potentially life threatening issues. If you have declined to go to the ED at this time, you should call your PCP immediately to set up a follow up appointment.    ED Prescriptions   None    PDMP not reviewed this encounter.               [1]  Social History Tobacco Use   Smoking status: Former    Current packs/day: 0.00    Average packs/day: 1 pack/day for 28.0 years (28.0 ttl pk-yrs)    Types: Cigarettes    Start date: 62    Quit date: 20    Years since quitting: 32.0   Smokeless tobacco: Never  Vaping Use   Vaping status: Never Used  Substance Use Topics   Alcohol use: Not  Currently    Alcohol/week: 2.0 standard drinks of alcohol    Types: 2 Glasses of wine per week   Drug use: No     Dennys Guin, DO 11/12/24 1803  "
